# Patient Record
Sex: Female | Born: 1990 | Race: Black or African American | Hispanic: No | Marital: Married | Smoking: Former smoker
Health system: Southern US, Community
[De-identification: ages and names within clinical notes are randomized; demographics above are authoritative.]

## PROBLEM LIST (undated history)

## (undated) ENCOUNTER — Inpatient Hospital Stay (HOSPITAL_COMMUNITY): Payer: Self-pay

## (undated) DIAGNOSIS — N189 Chronic kidney disease, unspecified: Secondary | ICD-10-CM

## (undated) DIAGNOSIS — L0291 Cutaneous abscess, unspecified: Secondary | ICD-10-CM

## (undated) DIAGNOSIS — L309 Dermatitis, unspecified: Secondary | ICD-10-CM

## (undated) DIAGNOSIS — Z789 Other specified health status: Secondary | ICD-10-CM

## (undated) HISTORY — PX: NO PAST SURGERIES: SHX2092

---
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
## 2009-07-14 ENCOUNTER — Emergency Department (HOSPITAL_COMMUNITY): Admission: EM | Admit: 2009-07-14 | Discharge: 2009-07-14 | Payer: Self-pay | Admitting: Emergency Medicine

## 2009-07-24 ENCOUNTER — Emergency Department (HOSPITAL_COMMUNITY): Admission: EM | Admit: 2009-07-24 | Discharge: 2009-07-24 | Payer: Self-pay | Admitting: Family Medicine

## 2009-11-21 ENCOUNTER — Emergency Department (HOSPITAL_COMMUNITY): Admission: EM | Admit: 2009-11-21 | Discharge: 2009-11-21 | Payer: Self-pay | Admitting: Family Medicine

## 2009-11-24 ENCOUNTER — Emergency Department (HOSPITAL_COMMUNITY): Admission: EM | Admit: 2009-11-24 | Discharge: 2009-11-24 | Payer: Self-pay | Admitting: Family Medicine

## 2011-03-12 LAB — POCT PREGNANCY, URINE: Preg Test, Ur: NEGATIVE

## 2011-09-15 LAB — POCT URINALYSIS DIP (DEVICE)
Glucose, UA: NEGATIVE
Ketones, ur: NEGATIVE
Nitrite: NEGATIVE
Operator id: 270961
Protein, ur: NEGATIVE
Specific Gravity, Urine: 1.025
Urobilinogen, UA: 0.2
pH: 6.5

## 2011-09-15 LAB — POCT PREGNANCY, URINE
Operator id: 270961
Preg Test, Ur: NEGATIVE

## 2012-02-08 ENCOUNTER — Emergency Department (HOSPITAL_COMMUNITY): Payer: Self-pay

## 2012-02-08 ENCOUNTER — Encounter (HOSPITAL_COMMUNITY): Payer: Self-pay | Admitting: *Deleted

## 2012-02-08 ENCOUNTER — Emergency Department (HOSPITAL_COMMUNITY)
Admission: EM | Admit: 2012-02-08 | Discharge: 2012-02-08 | Disposition: A | Discharge status: Home Health | Payer: Self-pay | Attending: Emergency Medicine | Admitting: Emergency Medicine

## 2012-02-08 DIAGNOSIS — M25561 Pain in right knee: Secondary | ICD-10-CM

## 2012-02-08 DIAGNOSIS — M25569 Pain in unspecified knee: Secondary | ICD-10-CM | POA: Insufficient documentation

## 2012-02-08 DIAGNOSIS — IMO0002 Reserved for concepts with insufficient information to code with codable children: Secondary | ICD-10-CM | POA: Insufficient documentation

## 2012-02-08 DIAGNOSIS — M25562 Pain in left knee: Secondary | ICD-10-CM

## 2012-02-08 LAB — PREGNANCY, URINE: Preg Test, Ur: NEGATIVE

## 2012-02-08 MED ORDER — HYDROCODONE-ACETAMINOPHEN 5-500 MG PO TABS: 1.0000 | ORAL_TABLET | Freq: Four times a day (QID) | ORAL | Status: AC | PRN

## 2012-02-08 MED ORDER — HYDROCODONE-ACETAMINOPHEN 5-325 MG PO TABS
1.0000 | ORAL_TABLET | Freq: Once | ORAL | Status: AC
  Administered 2012-02-08: 1 via ORAL
  Filled 2012-02-08: qty 1

## 2012-02-08 NOTE — ED Provider Notes (Signed)
I saw and evaluated the patient, reviewed the resident's note and I agree with the findings and plan.  Patient seen and examined. X-rays reviewed. Patient stable for discharge  Toy Baker, MD 02/08/12 1932
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
## 2012-02-08 NOTE — ED Notes (Addendum)
Log rolled per ED MD. LSB removed. C-collar remains in place
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
## 2012-02-08 NOTE — ED Notes (Signed)
Patient is AOx4 and comfortable with her discharge instructions.  The patient's mother is driving her home.
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
## 2012-02-10 NOTE — ED Provider Notes (Signed)
I saw and evaluated the patient, reviewed the resident's note and I agree with the findings and plan.  Story Vanvranken T Shiraz Bastyr, MD 02/10/12 0919 

## 2012-05-15 ENCOUNTER — Encounter (HOSPITAL_COMMUNITY): Payer: Self-pay

## 2012-05-15 ENCOUNTER — Emergency Department (HOSPITAL_COMMUNITY)
Admission: EM | Admit: 2012-05-15 | Discharge: 2012-05-15 | Disposition: A | Discharge status: Home / Self Care | Payer: Self-pay | Attending: Emergency Medicine | Admitting: Emergency Medicine

## 2012-05-15 DIAGNOSIS — O99330 Smoking (tobacco) complicating pregnancy, unspecified trimester: Secondary | ICD-10-CM | POA: Insufficient documentation

## 2012-05-15 DIAGNOSIS — O2390 Unspecified genitourinary tract infection in pregnancy, unspecified trimester: Secondary | ICD-10-CM | POA: Insufficient documentation

## 2012-05-15 DIAGNOSIS — O210 Mild hyperemesis gravidarum: Secondary | ICD-10-CM | POA: Insufficient documentation

## 2012-05-15 DIAGNOSIS — A499 Bacterial infection, unspecified: Secondary | ICD-10-CM | POA: Insufficient documentation

## 2012-05-15 DIAGNOSIS — N760 Acute vaginitis: Secondary | ICD-10-CM | POA: Insufficient documentation

## 2012-05-15 DIAGNOSIS — O219 Vomiting of pregnancy, unspecified: Secondary | ICD-10-CM

## 2012-05-15 DIAGNOSIS — B9689 Other specified bacterial agents as the cause of diseases classified elsewhere: Secondary | ICD-10-CM | POA: Insufficient documentation

## 2012-05-15 LAB — URINE MICROSCOPIC-ADD ON

## 2012-05-15 LAB — URINALYSIS, ROUTINE W REFLEX MICROSCOPIC
Ketones, ur: >80 mg/dL — AB
Leukocytes, UA: NEGATIVE
Nitrite: NEGATIVE
Specific Gravity, Urine: 1.035 — ABNORMAL HIGH (ref 1.005–1.030)
Urobilinogen, UA: 0.2 mg/dL (ref 0.0–1.0)
pH: 6 (ref 5.0–8.0)

## 2012-05-15 LAB — WET PREP, GENITAL
Trich, Wet Prep: NONE SEEN
Yeast Wet Prep HPF POC: NONE SEEN

## 2012-05-15 MED ORDER — SODIUM CHLORIDE 0.9 % IV BOLUS (SEPSIS)
1000.0000 mL | Freq: Once | INTRAVENOUS | Status: AC
  Administered 2012-05-15: 1000 mL via INTRAVENOUS

## 2012-05-15 MED ORDER — ONDANSETRON 8 MG PO TBDP: 8.0000 mg | ORAL_TABLET | Freq: Three times a day (TID) | ORAL | Status: AC | PRN

## 2012-05-15 MED ORDER — METRONIDAZOLE 500 MG PO TABS: 500.0000 mg | ORAL_TABLET | Freq: Two times a day (BID) | ORAL | Status: AC

## 2012-05-15 MED ORDER — ONDANSETRON HCL 4 MG/2ML IJ SOLN
4.0000 mg | Freq: Once | INTRAMUSCULAR | Status: AC
  Administered 2012-05-15: 4 mg via INTRAVENOUS
  Filled 2012-05-15: qty 2

## 2012-05-15 NOTE — Discharge Instructions (Signed)
Morning Sickness Morning sickness is when you feel sick to your stomach (nauseous) during pregnancy. This nauseous feeling may or may not come with throwing up (vomiting). It often occurs in the morning, but can be a problem any time of day. While morning sickness is unpleasant, it is usually harmless unless you develop severe and continual vomiting (hyperemesis gravidarum). This condition requires more intense treatment. CAUSES  The cause of morning sickness is not completely known but seems to be related to a sudden increase of two hormones:   Human chorionic gonadotropin (hCG).   Estrogen hormone.  These are elevated in the first part of the pregnancy. TREATMENT  Do not use any medicines (prescription, over-the-counter, or herbal) for morning sickness without first talking to your caregiver. Some patients are helped by the following:  Vitamin B6 (25mg  every 8 hours) or vitamin B6 shots.   An antihistamine called doxylamine (10mg  every 8 hours).   The herbal medication ginger.  HOME CARE INSTRUCTIONS   Taking multivitamins before getting pregnant can prevent or decrease the severity of morning sickness in most women.   Eat a piece of dry toast or unsalted crackers before getting out of bed in the morning.   Eat 5 or 6 small meals a day.   Eat dry and bland foods (rice, baked potato).   Do not drink liquids with your meals. Drink liquids between meals.   Avoid greasy, fatty, and spicy foods.   Get someone to cook for you if the smell of any food causes nausea and vomiting.   Avoid vitamin pills with iron because iron can cause nausea.   Snack on protein foods between meals if you are hungry.   Eat unsweetened gelatins for deserts.   Wear an acupressure wristband (worn for sea sickness) may be helpful.   Acupuncture may be helpful.   Do not smoke.   Get a humidifier to keep the air in your house free of odors.  SEEK MEDICAL CARE IF:   Your home remedies are not working  and you need medication.   You feel dizzy or lightheaded.   You are losing weight.   You need help with your diet.  SEEK IMMEDIATE MEDICAL CARE IF:   You have persistent and uncontrolled nausea and vomiting.   You pass out (faint).   You have a fever.  MAKE SURE YOU:   Understand these instructions.   Will watch your condition.   Will get help right away if you are not doing well or get worse.  Document Released: 01/12/2007 Document Revised: 11/10/2011 Document Reviewed: 11/09/2007 [facility] Patient Information 2012 [place], [place].Bacterial Vaginosis Bacterial vaginosis (BV) is a vaginal infection where the normal balance of bacteria in the vagina is disrupted. The normal balance is then replaced by an overgrowth of certain bacteria. There are several different kinds of bacteria that can cause BV. BV is the most common vaginal infection in women of childbearing age. CAUSES   The cause of BV is not fully understood. BV develops when there is an increase or imbalance of harmful bacteria.   Some activities or behaviors can upset the normal balance of bacteria in the vagina and put women at increased risk including:   Having a new sex partner or multiple sex partners.   Douching.   Using an intrauterine device (IUD) for contraception.   It is not clear what role sexual activity plays in the development of BV. However, women that have never had sexual intercourse are rarely infected with  BV.  Women do not get BV from toilet seats, bedding, swimming pools or from touching objects around them.  SYMPTOMS   Grey vaginal discharge.   A fish-like odor with discharge, especially after sexual intercourse.   Itching or burning of the vagina and vulva.   Burning or pain with urination.   Some women have no signs or symptoms at all.  DIAGNOSIS  Your caregiver must examine the vagina for signs of BV. Your caregiver will perform lab tests and look at the sample of vaginal fluid  through a microscope. They will look for bacteria and abnormal cells (clue cells), a pH test higher than 4.5, and a positive amine test all associated with BV.  RISKS AND COMPLICATIONS   Pelvic inflammatory disease (PID).   Infections following gynecology surgery.   Developing HIV.   Developing herpes virus.  TREATMENT  Sometimes BV will clear up without treatment. However, all women with symptoms of BV should be treated to avoid complications, especially if gynecology surgery is planned. Female partners generally do not need to be treated. However, BV may spread between female sex partners so treatment is helpful in preventing a recurrence of BV.   BV may be treated with antibiotics. The antibiotics come in either pill or vaginal cream forms. Either can be used with nonpregnant or pregnant women, but the recommended dosages differ. These antibiotics are not harmful to the baby.   BV can recur after treatment. If this happens, a second round of antibiotics will often be prescribed.   Treatment is important for pregnant women. If not treated, BV can cause a premature delivery, especially for a pregnant woman who had a premature birth in the past. All pregnant women who have symptoms of BV should be checked and treated.   For chronic reoccurrence of BV, treatment with a type of prescribed gel vaginally twice a week is helpful.  HOME CARE INSTRUCTIONS   Finish all medication as directed by your caregiver.   Do not have sex until treatment is completed.   Tell your sexual partner that you have a vaginal infection. They should see their caregiver and be treated if they have problems, such as a mild rash or itching.   Practice safe sex. Use condoms. Only have 1 sex partner.  PREVENTION  Basic prevention steps can help reduce the risk of upsetting the natural balance of bacteria in the vagina and developing BV:  Do not have sexual intercourse (be abstinent).   Do not douche.   Use all of  the medicine prescribed for treatment of BV, even if the signs and symptoms go away.   Tell your sex partner if you have BV. That way, they can be treated, if needed, to prevent reoccurrence.  SEEK MEDICAL CARE IF:   Your symptoms are not improving after 3 days of treatment.   You have increased discharge, pain, or fever.  MAKE SURE YOU:   Understand these instructions.   Will watch your condition.   Will get help right away if you are not doing well or get worse.  FOR MORE INFORMATION  Division of STD Prevention (DSTDP), Centers for Disease Control and Prevention: [employer] American Social Health Association (ASHA): www.ashastd.org  Document Released: 11/21/2005 Document Revised: 11/10/2011 Document Reviewed: 05/14/2009 [facility] Patient Information 2012 [place], [place].

## 2012-05-15 NOTE — ED Notes (Signed)
Pt. Also had 1 day of vaginal bleeding and also has a discharge

## 2012-05-15 NOTE — ED Notes (Signed)
NAUSEA AND VOMITNG SINCE THE 6TH.   Pt.  Is approximately 1 month pregnant

## 2012-05-15 NOTE — ED Notes (Signed)
Pt states she hasnt had a period in over 73mo and took home preg test and was positive. Pt states she has had n/v xseveral days. Pt states that she vomits intermittently throughout the day. Pt states she has vomited 3x today.

## 2012-05-15 NOTE — ED Notes (Signed)
Patient is AOx4 and comfortable with her discharge instructions. 

## 2012-05-15 NOTE — ED Provider Notes (Signed)
History     CSN: [id number]  Arrival date & time 05/15/12  1640   First MD Initiated Contact with Patient 05/15/12 1826      Chief Complaint  Patient presents with  . Emesis During Pregnancy    (Consider location/radiation/quality/duration/timing/severity/associated sxs/prior treatment) The history is provided by the patient.   nausea and vomiting for the last 5 days. Patient is pregnant. She states her last period was the end of April and May 1. She's positive home pregnancy test, but has not seen a provider yet. This is her first pregnancy. No diarrhea. Some mild abdominal cramping with vomiting. No fevers. She states she has had a little bit of white vaginal discharge. She also had a little bit of brownish vaginal bleeding yesterday. Patient states she's not excited about being pregnant. No chest pain. The lightheadedness or dizziness. She states she still has a good appetite, but is very nauseous.  History reviewed. No pertinent past medical history.  History reviewed. No pertinent past surgical history.  No family history on file.  History  Substance Use Topics  . Smoking status: Current Everyday Smoker  . Smokeless tobacco: Not on file  . Alcohol Use: Yes     occasionaly, twice weekly    OB History    Grav Para Term Preterm Abortions TAB SAB Ect Mult Living                  Review of Systems  Constitutional: Negative for activity change and appetite change.  HENT: Negative for neck stiffness.   Eyes: Negative for pain.  Respiratory: Negative for chest tightness and shortness of breath.   Cardiovascular: Negative for chest pain and leg swelling.  Gastrointestinal: Positive for nausea and vomiting. Negative for abdominal pain and diarrhea.  Genitourinary: Positive for vaginal bleeding and vaginal discharge. Negative for flank pain.  Musculoskeletal: Negative for back pain.  Skin: Negative for rash.  Neurological: Negative for weakness, numbness and headaches.    Psychiatric/Behavioral: Negative for behavioral problems.    Allergies  Review of patient's allergies indicates no known allergies.  Home Medications   Current Outpatient Rx  Name Route Sig Dispense Refill  . METRONIDAZOLE 500 MG PO TABS Oral Take 1 tablet (500 mg total) by mouth 2 (two) times daily. 14 tablet 0  . ONDANSETRON 8 MG PO TBDP Oral Take 1 tablet (8 mg total) by mouth every 8 (eight) hours as needed for nausea. 20 tablet 0    BP 138/83  Pulse 84  Temp(Src) 99.6 F (37.6 C) (Oral)  Resp 18  Ht 5\' 8"  (1.727 m)  Wt 157 lb (71.215 kg)  BMI 23.87 kg/m2  SpO2 100%  LMP 03/26/2012  Physical Exam  Nursing note and vitals reviewed. Constitutional: She is oriented to person, place, and time. She appears well-developed and well-nourished.  Cardiovascular: Normal rate, regular rhythm and normal heart sounds.   No murmur heard. Pulmonary/Chest: Effort normal and breath sounds normal. No respiratory distress. She has no wheezes. She has no rales.  Abdominal: Soft. Bowel sounds are normal. She exhibits no distension and no mass. There is no tenderness. There is no rebound and no guarding.  Musculoskeletal: Normal range of motion.  Neurological: She is alert and oriented to person, place, and time. No cranial nerve deficit.  Skin: Skin is warm and dry.  Psychiatric: She has a normal mood and affect. Her speech is normal.   pelvic exam showed minimal white discharge with no cervical motion tenderness or adnexal mass.  She does have an apparent gravid uterus. Os is closed  ED Course  Procedures (including critical care time)  Labs Reviewed  URINALYSIS, ROUTINE W REFLEX MICROSCOPIC - Abnormal; Notable for the following:    APPearance HAZY (*)    Specific Gravity, Urine 1.035 (*)    Bilirubin Urine SMALL (*)    Ketones, ur >80 (*)    Protein, ur 100 (*)    All other components within normal limits  PREGNANCY, URINE - Abnormal; Notable for the following:    Preg Test, Ur  POSITIVE (*)    All other components within normal limits  WET PREP, GENITAL - Abnormal; Notable for the following:    Clue Cells Wet Prep HPF POC MODERATE (*)    WBC, Wet Prep HPF POC MODERATE (*)    All other components within normal limits  URINE MICROSCOPIC-ADD ON - Abnormal; Notable for the following:    Squamous Epithelial / LPF FEW (*)    Casts HYALINE CASTS (*)    All other components within normal limits  GC/CHLAMYDIA PROBE AMP, GENITAL   No results found.   1. Nausea and vomiting in pregnancy   2. Bacterial vaginosis       MDM  Patient with nausea and vomiting for the last 5 days. She is pregnant. Her last period was the end of April. She has a positive pregnancy test here. She has a high specific gravity in her urine and greater than 80 ketones. Patient feels much better after nearly 2 L of fluid. She's tolerated orals will be discharged home. Pelvic exam showed a bacterial vaginosis and she will be treated. She will followup with gynecology        Juliet Rude. Rubin Payor, MD 05/15/12 2129

## 2012-05-16 LAB — GC/CHLAMYDIA PROBE AMP, GENITAL: Chlamydia, DNA Probe: NEGATIVE

## 2012-06-19 ENCOUNTER — Emergency Department (HOSPITAL_COMMUNITY)
Admission: EM | Admit: 2012-06-19 | Discharge: 2012-06-20 | Disposition: A | Discharge status: Home / Self Care | Payer: Self-pay | Attending: Emergency Medicine | Admitting: Emergency Medicine

## 2012-06-19 ENCOUNTER — Encounter (HOSPITAL_COMMUNITY): Payer: Self-pay | Admitting: Emergency Medicine

## 2012-06-19 DIAGNOSIS — O210 Mild hyperemesis gravidarum: Secondary | ICD-10-CM | POA: Insufficient documentation

## 2012-06-19 DIAGNOSIS — R112 Nausea with vomiting, unspecified: Secondary | ICD-10-CM

## 2012-06-19 DIAGNOSIS — Z87891 Personal history of nicotine dependence: Secondary | ICD-10-CM | POA: Insufficient documentation

## 2012-06-19 LAB — CBC WITH DIFFERENTIAL/PLATELET
Basophils Absolute: 0 10*3/uL (ref 0.0–0.1)
Basophils Relative: 0 % (ref 0–1)
Eosinophils Absolute: 0.1 10*3/uL (ref 0.0–0.7)
Eosinophils Relative: 1 % (ref 0–5)
HCT: 39.9 % (ref 36.0–46.0)
Lymphocytes Relative: 22 % (ref 12–46)
MCHC: 36.1 g/dL — ABNORMAL HIGH (ref 30.0–36.0)
MCV: 78.1 fL (ref 78.0–100.0)
Monocytes Absolute: 0.5 10*3/uL (ref 0.1–1.0)
Platelets: 321 10*3/uL (ref 150–400)
RDW: 12.1 % (ref 11.5–15.5)
WBC: 8.2 10*3/uL (ref 4.0–10.5)

## 2012-06-19 LAB — POCT I-STAT, CHEM 8
Chloride: 106 mEq/L (ref 96–112)
Creatinine, Ser: 0.7 mg/dL (ref 0.50–1.10)
Glucose, Bld: 114 mg/dL — ABNORMAL HIGH (ref 70–99)
HCT: 44 % (ref 36.0–46.0)
Hemoglobin: 15 g/dL (ref 12.0–15.0)
Potassium: 3.6 mEq/L (ref 3.5–5.1)
Sodium: 139 mEq/L (ref 135–145)

## 2012-06-19 LAB — COMPREHENSIVE METABOLIC PANEL
ALT: 10 U/L (ref 0–35)
AST: 27 U/L (ref 0–37)
Albumin: 4.2 g/dL (ref 3.5–5.2)
Calcium: 10.2 mg/dL (ref 8.4–10.5)
Creatinine, Ser: 0.57 mg/dL (ref 0.50–1.10)
Sodium: 136 mEq/L (ref 135–145)
Total Protein: 9.1 g/dL — ABNORMAL HIGH (ref 6.0–8.3)

## 2012-06-19 MED ORDER — PROMETHAZINE HCL 25 MG RE SUPP: 25.0000 mg | Freq: Four times a day (QID) | RECTAL | Status: DC | PRN

## 2012-06-19 MED ORDER — SODIUM CHLORIDE 0.9 % IV BOLUS (SEPSIS): 500.0000 mL | Freq: Once | INTRAVENOUS | Status: DC

## 2012-06-19 MED ORDER — PROMETHAZINE HCL 25 MG/ML IJ SOLN
12.5000 mg | Freq: Once | INTRAMUSCULAR | Status: AC
  Administered 2012-06-19: 12.5 mg via INTRAVENOUS
  Filled 2012-06-19: qty 1

## 2012-06-19 MED ORDER — ONDANSETRON HCL 4 MG/2ML IJ SOLN
4.0000 mg | Freq: Once | INTRAMUSCULAR | Status: AC
  Administered 2012-06-19: 4 mg via INTRAVENOUS
  Filled 2012-06-19: qty 2

## 2012-06-19 MED ORDER — SODIUM CHLORIDE 0.9 % IV SOLN
1000.0000 mL | INTRAVENOUS | Status: DC
  Administered 2012-06-19 (×2): 1000 mL via INTRAVENOUS

## 2012-06-19 MED ORDER — DEXTROSE 5 % AND 0.9 % NACL IV BOLUS
1000.0000 mL | Freq: Once | INTRAVENOUS | Status: AC
  Administered 2012-06-19: 1000 mL via INTRAVENOUS

## 2012-06-19 NOTE — ED Notes (Signed)
Pt reports she is about [redacted] weeks pregnant and for almost month or more has been vomiting, unable to keep anything down; pt actively vomiting at triage; pt was here last month for same and d/c'ed home on zofran- reports zofran is not working

## 2012-06-19 NOTE — ED Notes (Signed)
Pt eating ice chips with no complaints of increased nausea or vomiting.

## 2012-06-19 NOTE — ED Provider Notes (Signed)
History     CSN: [id number]  Arrival date & time 06/19/12  2030   First MD Initiated Contact with Patient 06/19/12 2137      Chief Complaint  Patient presents with  . Hyperemesis Gravidarum    (Consider location/radiation/quality/duration/timing/severity/associated sxs/prior treatment) The history is provided by the patient.  pt g1p0 states is [redacted] weeks pregnant. States for past 1-2 months intermittent nv. Emesis clear, or color recent ingested food/liquids, no bloody or bilious emesis. Having normal bms, including one today, no diarrhea or constipation. No abdominal or pelvic pain/cramping, no back or flank pain. No vaginal bleeding or discharge. Urinating regularly, no urgency or dysuria. Denies fever or chils. Denies abdominal distension or prior abd surgery. No hx pud or gallstones.  No fainting/near syncope.   History reviewed. No pertinent past medical history.  History reviewed. No pertinent past surgical history.  History reviewed. No pertinent family history.  History  Substance Use Topics  . Smoking status: Former Games developer  . Smokeless tobacco: Not on file  . Alcohol Use: No    OB History    Grav Para Term Preterm Abortions TAB SAB Ect Mult Living   1               Review of Systems  Constitutional: Negative for fever and chills.  Respiratory: Negative for cough and shortness of breath.   Cardiovascular: Negative for chest pain.  Gastrointestinal: Negative for abdominal pain.  Genitourinary: Negative for dysuria, vaginal bleeding and vaginal discharge.  Musculoskeletal: Negative for back pain.  Neurological: Negative for headaches.    Allergies  Review of patient's allergies indicates no known allergies.  Home Medications   Current Outpatient Rx  Name Route Sig Dispense Refill  . METRONIDAZOLE 500 MG PO TABS Oral Take 500 mg by mouth 2 (two) times daily.    Marland Kitchen ONDANSETRON 8 MG PO TBDP Oral Take 8 mg by mouth every 8 (eight) hours as needed. For nausea       BP 141/91  Pulse 86  Resp 22  SpO2 100%  LMP 03/30/2012  Physical Exam  Nursing note and vitals reviewed. Constitutional: She is oriented to person, place, and time. She appears well-developed and well-nourished. No distress.  HENT:  Nose: Nose normal.  Mouth/Throat: Oropharynx is clear and moist.  Eyes: Conjunctivae are normal. No scleral icterus.  Neck: Neck supple. No tracheal deviation present.  Cardiovascular: Normal rate, regular rhythm, normal heart sounds and intact distal pulses.   Pulmonary/Chest: Effort normal and breath sounds normal. No respiratory distress.  Abdominal: Soft. Normal appearance and bowel sounds are normal. She exhibits no distension and no mass. There is no tenderness. There is no rebound and no guarding.  Genitourinary:       No  cva tenderness  Musculoskeletal: She exhibits no edema.  Neurological: She is alert and oriented to person, place, and time.  Skin: Skin is warm and dry. No rash noted.  Psychiatric: She has a normal mood and affect.    ED Course  Procedures (including critical care time)   Results for orders placed during the hospital encounter of 06/19/12  CBC WITH DIFFERENTIAL      Component Value Range   WBC 8.2  4.0 - 10.5 K/uL   RBC 5.11  3.87 - 5.11 MIL/uL   Hemoglobin 14.4  12.0 - 15.0 g/dL   HCT 11.9  14.7 - 82.9 %   MCV 78.1  78.0 - 100.0 fL   MCH 28.2  26.0 - 34.0 pg  MCHC 36.1 (*) 30.0 - 36.0 g/dL   RDW 29.5  62.1 - 30.8 %   Platelets 321  150 - 400 K/uL   Neutrophils Relative 72  43 - 77 %   Neutro Abs 5.9  1.7 - 7.7 K/uL   Lymphocytes Relative 22  12 - 46 %   Lymphs Abs 1.8  0.7 - 4.0 K/uL   Monocytes Relative 6  3 - 12 %   Monocytes Absolute 0.5  0.1 - 1.0 K/uL   Eosinophils Relative 1  0 - 5 %   Eosinophils Absolute 0.1  0.0 - 0.7 K/uL   Basophils Relative 0  0 - 1 %   Basophils Absolute 0.0  0.0 - 0.1 K/uL  COMPREHENSIVE METABOLIC PANEL      Component Value Range   Sodium 136  135 - 145 mEq/L    Potassium 3.9  3.5 - 5.1 mEq/L   Chloride 97  96 - 112 mEq/L   CO2 20  19 - 32 mEq/L   Glucose, Bld 111 (*) 70 - 99 mg/dL   BUN 7  6 - 23 mg/dL   Creatinine, Ser 6.57  0.50 - 1.10 mg/dL   Calcium 84.6  8.4 - 96.2 mg/dL   Total Protein 9.1 (*) 6.0 - 8.3 g/dL   Albumin 4.2  3.5 - 5.2 g/dL   AST 27  0 - 37 U/L   ALT 10  0 - 35 U/L   Alkaline Phosphatase 56  39 - 117 U/L   Total Bilirubin 0.5  0.3 - 1.2 mg/dL   GFR calc non Af Amer >90  >90 mL/min   GFR calc Af Amer >90  >90 mL/min  POCT I-STAT, CHEM 8      Component Value Range   Sodium 139  135 - 145 mEq/L   Potassium 3.6  3.5 - 5.1 mEq/L   Chloride 106  96 - 112 mEq/L   BUN 8  6 - 23 mg/dL   Creatinine, Ser 9.52  0.50 - 1.10 mg/dL   Glucose, Bld 841 (*) 70 - 99 mg/dL   Calcium, Ion 3.24  4.01 - 1.23 mmol/L   TCO2 21  0 - 100 mmol/L   Hemoglobin 15.0  12.0 - 15.0 g/dL   HCT 02.7  25.3 - 66.4 %        MDM  Iv ns bolus.  zofran iv.  Mild nausea persists. Phenergan 12.5 mg iv. d5ns iv.   Recheck pt improved. Tolerating po. No recurrent nv. abd soft nt. No abd or pelvic pain. No gu c/o.         Suzi Roots, MD 06/19/12 [phone]

## 2012-06-20 LAB — URINE MICROSCOPIC-ADD ON

## 2012-06-20 LAB — URINALYSIS, ROUTINE W REFLEX MICROSCOPIC
Bilirubin Urine: NEGATIVE
Hgb urine dipstick: NEGATIVE
Specific Gravity, Urine: 1.027 (ref 1.005–1.030)
Urobilinogen, UA: 0.2 mg/dL (ref 0.0–1.0)
pH: 7 (ref 5.0–8.0)

## 2012-06-20 NOTE — ED Notes (Signed)
Pt states she is not happy with her care because she feels the same now as when she came in although pt denies nausea at this time. Pt now complaining of pain, pt's complaint of pain discussed with Dr Norlene Campbell and verbal order for Tylenol obtained. Pt refuses Tylenol stating she just wants to go home. Pt offered crackers and ginger ale prior to departure but pt refused stating "that don't help". Pt refuses to sign for discharge at this time.

## 2012-11-26 ENCOUNTER — Inpatient Hospital Stay (HOSPITAL_COMMUNITY)
Admission: AD | Admit: 2012-11-26 | Discharge: 2012-11-26 | Disposition: A | Discharge status: Hospice | Payer: Medicaid Other | Source: Ambulatory Visit | Attending: Obstetrics & Gynecology | Admitting: Obstetrics & Gynecology

## 2012-11-26 ENCOUNTER — Encounter (HOSPITAL_COMMUNITY): Payer: Self-pay | Admitting: *Deleted

## 2012-11-26 DIAGNOSIS — B3731 Acute candidiasis of vulva and vagina: Secondary | ICD-10-CM | POA: Insufficient documentation

## 2012-11-26 DIAGNOSIS — B373 Candidiasis of vulva and vagina: Secondary | ICD-10-CM

## 2012-11-26 DIAGNOSIS — O0930 Supervision of pregnancy with insufficient antenatal care, unspecified trimester: Secondary | ICD-10-CM

## 2012-11-26 DIAGNOSIS — O2390 Unspecified genitourinary tract infection in pregnancy, unspecified trimester: Secondary | ICD-10-CM | POA: Insufficient documentation

## 2012-11-26 HISTORY — DX: Other specified health status: Z78.9

## 2012-11-26 LAB — CBC
Hemoglobin: 10.3 g/dL — ABNORMAL LOW (ref 12.0–15.0)
MCH: 26.5 pg (ref 26.0–34.0)
MCHC: 32.8 g/dL (ref 30.0–36.0)
Platelets: 214 10*3/uL (ref 150–400)
RBC: 3.89 MIL/uL (ref 3.87–5.11)

## 2012-11-26 LAB — URINALYSIS, ROUTINE W REFLEX MICROSCOPIC
Glucose, UA: NEGATIVE mg/dL
Ketones, ur: NEGATIVE mg/dL
Nitrite: NEGATIVE
Protein, ur: NEGATIVE mg/dL
Urobilinogen, UA: 0.2 mg/dL (ref 0.0–1.0)

## 2012-11-26 LAB — URINE MICROSCOPIC-ADD ON

## 2012-11-26 LAB — HEPATITIS B SURFACE ANTIGEN: Hepatitis B Surface Ag: NEGATIVE

## 2012-11-26 LAB — HIV ANTIBODY (ROUTINE TESTING W REFLEX): HIV: NONREACTIVE

## 2012-11-26 LAB — RPR: RPR Ser Ql: NONREACTIVE

## 2012-11-26 LAB — WET PREP, GENITAL: Trich, Wet Prep: NONE SEEN

## 2012-11-26 LAB — ABO/RH: ABO/RH(D): O POS

## 2012-11-26 MED ORDER — FLUCONAZOLE 150 MG PO TABS
150.0000 mg | ORAL_TABLET | Freq: Once | ORAL | Status: AC
  Administered 2012-11-26: 150 mg via ORAL
  Filled 2012-11-26: qty 1

## 2012-11-26 NOTE — MAU Provider Note (Signed)
Chart reviewed and agree with management and plan.  

## 2012-11-26 NOTE — MAU Note (Signed)
Has not been seen since June, no care. "has been kind of depressed, in a funk". Hasn't been taking care of self.

## 2012-11-26 NOTE — MAU Provider Note (Signed)
History     CSN: [id number]  Arrival date and time: 11/26/12 1417   First Provider Initiated Contact with Patient 11/26/12 1621      Chief Complaint  Patient presents with  . ~ 35wks, needs preg verification    HPI 21 y.o. G1P0 at [redacted]w[redacted]d with no prenatal care here to get pregnancy verification letter for Medicaid. Unexpected pregnancy, has not established care/Medicaid because still adjusting to being pregnant. Does c/o vaginal itching, moderate discharge - white, no odor. No dysuria. No nausea/vomiting/diarrhea. Feeling fetal movement, no contractions, bleeding or loss of fluid. No hx STDs.  LMP:  03/30/12.    OB History    Grav Para Term Preterm Abortions TAB SAB Ect Mult Living   1               Past Medical History  Diagnosis Date  . No pertinent past medical history     Past Surgical History  Procedure Date  . No past surgeries     Family History  Problem Relation Age of Onset  . Diabetes Mother   . Hypertension Mother   . Stroke Mother   . Heart disease Mother   . Diabetes Maternal Grandmother     History  Substance Use Topics  . Smoking status: Former Smoker    Quit date: 06/26/2012  . Smokeless tobacco: Not on file  . Alcohol Use: No  Marijuana before pregnancy  Allergies:  Allergies  Allergen Reactions  . Nickel Rash    Prescriptions prior to admission  Medication Sig Dispense Refill  . acetaminophen (TYLENOL) 500 MG tablet Take 1,000 mg by mouth every 6 (six) hours as needed. Tooth ache      . promethazine (PHENERGAN) 25 MG suppository Place 1 suppository (25 mg total) rectally every 6 (six) hours as needed for nausea.  12 suppository  0  . [DISCONTINUED] metroNIDAZOLE (FLAGYL) 500 MG tablet Take 500 mg by mouth 2 (two) times daily.      . [DISCONTINUED] ondansetron (ZOFRAN-ODT) 8 MG disintegrating tablet Take 8 mg by mouth every 8 (eight) hours as needed. For nausea        Review of Systems  Constitutional: Negative for fever and chills.   Eyes: Negative for blurred vision.  Respiratory: Negative for shortness of breath.   Gastrointestinal: Negative for nausea, vomiting, abdominal pain, diarrhea and constipation.  Genitourinary: Negative for dysuria and urgency.  Neurological: Negative for dizziness and headaches.   Physical Exam   Blood pressure 136/73, pulse 82, temperature 98.5 F (36.9 C), temperature source Oral, resp. rate 16, height 5\' 7"  (1.702 m), weight 68.947 kg (152 lb), last menstrual period 03/30/2012.  Physical Exam  Constitutional: She is oriented to person, place, and time. She appears well-developed and well-nourished. No distress.  HENT:  Head: Normocephalic and atraumatic.  Eyes: Conjunctivae normal and EOM are normal.  Neck: Normal range of motion. Neck supple.  Cardiovascular: Normal rate, regular rhythm and normal heart sounds.   Respiratory: Effort normal and breath sounds normal. No respiratory distress.  GI: Soft. Bowel sounds are normal. There is no tenderness. There is no rebound and no guarding.  Genitourinary:       Normal external genitalia and vagina. Small amount thick white discharge. Normal cervix, no CMT, closed/thick/high.    Musculoskeletal: Normal range of motion. She exhibits no edema and no tenderness.  Neurological: She is alert and oriented to person, place, and time.  Skin: Skin is warm and dry.  Psychiatric: She has a normal  mood and affect.   Results for orders placed during the hospital encounter of 11/26/12 (from the past 24 hour(s))  URINALYSIS, ROUTINE W REFLEX MICROSCOPIC     Status: Abnormal   Collection Time   11/26/12  3:05 PM      Component Value Range   Color, Urine YELLOW  YELLOW   APPearance CLOUDY (*) CLEAR   Specific Gravity, Urine 1.020  1.005 - 1.030   pH 6.5  5.0 - 8.0   Glucose, UA NEGATIVE  NEGATIVE mg/dL   Hgb urine dipstick NEGATIVE  NEGATIVE   Bilirubin Urine NEGATIVE  NEGATIVE   Ketones, ur NEGATIVE  NEGATIVE mg/dL   Protein, ur NEGATIVE   NEGATIVE mg/dL   Urobilinogen, UA 0.2  0.0 - 1.0 mg/dL   Nitrite NEGATIVE  NEGATIVE   Leukocytes, UA SMALL (*) NEGATIVE  URINE MICROSCOPIC-ADD ON     Status: Abnormal   Collection Time   11/26/12  3:05 PM      Component Value Range   Squamous Epithelial / LPF MANY (*) RARE   WBC, UA 0-2  <3 WBC/hpf   RBC / HPF 0-2  <3 RBC/hpf   Bacteria, UA FEW (*) RARE  WET PREP, GENITAL     Status: Abnormal   Collection Time   11/26/12  5:00 PM      Component Value Range   Yeast Wet Prep HPF POC FEW (*) NONE SEEN   Trich, Wet Prep NONE SEEN  NONE SEEN   Clue Cells Wet Prep HPF POC NONE SEEN  NONE SEEN   WBC, Wet Prep HPF POC MANY (*) NONE SEEN     MAU Course  Procedures  NST:  135, moderate variability, accels present, no decels.  Assessment and Plan  21 y.o. G1P0 at [redacted]w[redacted]d with no prenatal care. - Outpatient ultrasound for dating - Establish care - will [facility] and give list of providers including health dept. - Vaginal yeast infection - Letter for Medicaid provided - Labor precautions discussed.  Napoleon Form, MD 11/26/2012  5:28 PM   Napoleon Form 11/26/2012, 4:22 PM

## 2012-11-27 LAB — GC/CHLAMYDIA PROBE AMP: CT Probe RNA: NEGATIVE

## 2012-11-27 LAB — RUBELLA SCREEN: Rubella: 4.5 Index — ABNORMAL HIGH (ref <0.90)

## 2012-11-29 ENCOUNTER — Other Ambulatory Visit (HOSPITAL_COMMUNITY): Payer: Self-pay | Admitting: Family Medicine

## 2012-11-29 ENCOUNTER — Ambulatory Visit (HOSPITAL_COMMUNITY)
Admission: RE | Admit: 2012-11-29 | Discharge: 2012-11-29 | Disposition: A | Discharge status: Home / Self Care | Payer: Medicaid Other | Source: Ambulatory Visit | Attending: Family Medicine | Admitting: Family Medicine

## 2012-11-29 DIAGNOSIS — O0930 Supervision of pregnancy with insufficient antenatal care, unspecified trimester: Secondary | ICD-10-CM

## 2012-11-29 DIAGNOSIS — Z3689 Encounter for other specified antenatal screening: Secondary | ICD-10-CM | POA: Insufficient documentation

## 2012-12-05 NOTE — L&D Delivery Note (Signed)
Attestation of Attending Supervision of Advanced Practitioner (CNM/NP): Evaluation and management procedures were performed by the Advanced Practitioner under my supervision and collaboration.  I have reviewed the Advanced Practitioner's note and chart, and I agree with the management and plan.  Margeret Stachnik 01/03/2013 8:35 AM

## 2012-12-05 NOTE — L&D Delivery Note (Signed)
Delivery Note At 5:32 AM a viable female was delivered via Vaginal, Spontaneous Delivery (Presentation:Right Occiput Anterior) en caul w/ srom immediately after birth w/ large amount of light MSF.  APGAR:9/9 ; weight:pending .   Placenta status: Intact, Spontaneous, appears small.  Cord: 3 vessels with the following complications: None.  Cord pH: not done.  Anesthesia: Epidural  Episiotomy: None Lacerations: 1st degree Lt sulcus Suture Repair: 3.0 monocryl Est. Blood Loss (mL):  Mom to postpartum.  Baby to nursery-stable. Placenta to path. Plans to pump and bottlefeed, undecided about contraception, wants OP circumcision.  Marge Duncans 12/30/2012, 5:56 AM

## 2012-12-26 ENCOUNTER — Ambulatory Visit (INDEPENDENT_AMBULATORY_CARE_PROVIDER_SITE_OTHER): Payer: Medicaid Other | Admitting: Family Medicine

## 2012-12-26 ENCOUNTER — Other Ambulatory Visit (HOSPITAL_COMMUNITY)
Admission: RE | Admit: 2012-12-26 | Discharge: 2012-12-26 | Disposition: A | Discharge status: Home / Self Care | Payer: Medicaid Other | Source: Ambulatory Visit | Attending: Family Medicine | Admitting: Family Medicine

## 2012-12-26 ENCOUNTER — Encounter: Payer: Self-pay | Admitting: Family Medicine

## 2012-12-26 VITALS — BP 135/87 | Temp 98.0°F | Wt 175.3 lb

## 2012-12-26 DIAGNOSIS — Z23 Encounter for immunization: Secondary | ICD-10-CM

## 2012-12-26 DIAGNOSIS — O0930 Supervision of pregnancy with insufficient antenatal care, unspecified trimester: Secondary | ICD-10-CM | POA: Insufficient documentation

## 2012-12-26 DIAGNOSIS — Z113 Encounter for screening for infections with a predominantly sexual mode of transmission: Secondary | ICD-10-CM | POA: Insufficient documentation

## 2012-12-26 DIAGNOSIS — Z01419 Encounter for gynecological examination (general) (routine) without abnormal findings: Secondary | ICD-10-CM | POA: Insufficient documentation

## 2012-12-26 DIAGNOSIS — N760 Acute vaginitis: Secondary | ICD-10-CM | POA: Insufficient documentation

## 2012-12-26 LAB — POCT URINALYSIS DIP (DEVICE)
Bilirubin Urine: NEGATIVE
Hgb urine dipstick: NEGATIVE
Ketones, ur: NEGATIVE mg/dL
Protein, ur: NEGATIVE mg/dL
pH: 5.5 (ref 5.0–8.0)

## 2012-12-26 MED ORDER — INFLUENZA VIRUS VACC SPLIT PF IM SUSP
0.5000 mL | Freq: Once | INTRAMUSCULAR | Status: AC
  Administered 2012-12-26: 0.5 mL via INTRAMUSCULAR

## 2012-12-26 MED ORDER — TETANUS-DIPHTH-ACELL PERTUSSIS 5-2.5-18.5 LF-MCG/0.5 IM SUSP
0.5000 mL | Freq: Once | INTRAMUSCULAR | Status: AC
  Administered 2012-12-26: 0.5 mL via INTRAMUSCULAR

## 2012-12-26 NOTE — Progress Notes (Signed)
Informal [place] for presentation- vertex

## 2012-12-26 NOTE — Progress Notes (Signed)
Subjective:    JOELLYN GRANDT is being seen today for her first obstetrical visit.  This is not a planned pregnancy. Did not get prenatal care earlier because she was adjusting to the idea of being pregnant. She is at [redacted]w[redacted]d gestation by LMP c/w u/s at 34 weeks. Her obstetrical history is significant for none. Relationship with FOB: not involved, not together at this time. Patient states she has a lot of social and family support though. Patient does not want to breast feed but would like to try to pump. 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 Pregnancy history fully reviewed.  Stopped drinking when found out pregnant. Stopped smoking early pregnancy.   Menstrual History: OB History    Grav Para Term Preterm Abortions TAB SAB Ect Mult Living   1              Menarche age: 22 yrs Patient's last menstrual period was 03/30/2012.   The following portions of the patient's history were reviewed and updated as appropriate: allergies, current medications, past family history, past medical history, past social history, past surgical history and problem list.  Review of Systems Constitutional: negative except for chills, fevers and sweats Eyes: negative for visual disturbance Respiratory: negative for cough, dyspnea on exertion and wheezing Cardiovascular: negative for chest pain, dyspnea and palpitations Gastrointestinal: negative for abdominal pain, constipation, diarrhea, nausea and vomiting Genitourinary:negative for dysuria, urgency, vaginal bleeding Neurological: negative for headache, vision changes Behavioral/Psych: negative for depression, anxiety.     Objective:    General appearance: alert, cooperative and no distress Head: Normocephalic, without obvious abnormality, atraumatic Eyes: conjunctivae/corneas clear. PERRL, EOM's intact. Fundi benign. Neck: no adenopathy, supple, symmetrical, trachea midline and thyroid not enlarged, symmetric, no  tenderness/mass/nodules Lungs: clear to auscultation bilaterally Heart: regular rate and rhythm, S1, S2 normal, no murmur, click, rub or gallop Abdomen: soft, gravid size appropriate for dates, normal bowel sounds, non-tender Pelvic: external genitalia normal, no cervical motion tenderness, uterus normal size, shape, and consistency, vagina normal without discharge and cervix FT and long by digital exam. Bleeding noted from cervix after pap. Extremities: extremities normal, atraumatic, no cyanosis or edema Pulses: 2+ and symmetric Skin: Skin color, texture, turgor normal. No rashes or lesions Neurologic: Grossly normal    Assessment:    Pregnancy at 38 and 5/7 weeks    Plan:    Initial labs drawn. Prenatal vitamins. Problem list reviewed and updated. AFP3 discussed: too late. Role of ultrasound in pregnancy discussed; fetal survey: results reviewed. Amniocentesis discussed: not indicated. Follow up in 1 weeks. 50% of 45 min visit spent on counseling and coordination of care.

## 2012-12-26 NOTE — Progress Notes (Signed)
P=78, Here for initial prenatal visit. States baby has slowed down , but still feels baby move 10 times an hour.Given new patient booklet, discussed appropriate weight gain.

## 2012-12-26 NOTE — Patient Instructions (Addendum)
Pregnancy - Third Trimester  The third trimester of pregnancy (the last 3 months) is a period of the most rapid growth for you and your baby. The baby approaches a length of 20 inches and a weight of 6 to 10 pounds. The baby is adding on fat and getting ready for life outside your body. While inside, babies have periods of sleeping and waking, suck their thumbs, and hiccups. You can often feel small contractions of the uterus. This is false labor. It is also called Braxton-Hicks contractions. This is like a practice for labor. The usual problems in this stage of pregnancy include more difficulty breathing, swelling of the hands and feet from water retention, and having to urinate more often because of the uterus and baby pressing on your bladder.   PRENATAL EXAMS  · Blood work may continue to be done during prenatal exams. These tests are done to check on your health and the probable health of your baby. Blood work is used to follow your blood levels (hemoglobin). Anemia (low hemoglobin) is common during pregnancy. Iron and vitamins are given to help prevent this. You may also continue to be checked for diabetes. Some of the past blood tests may be done again.  · The size of the uterus is measured during each visit. This makes sure your baby is growing properly according to your pregnancy dates.  · Your blood pressure is checked every prenatal visit. This is to make sure you are not getting toxemia.  · Your urine is checked every prenatal visit for infection, diabetes and protein.  · Your weight is checked at each visit. This is done to make sure gains are happening at the suggested rate and that you and your baby are growing normally.  · Sometimes, an ultrasound is performed to confirm the position and the proper growth and development of the baby. This is a test done that bounces harmless sound waves off the baby so your caregiver can more accurately determine due dates.  · Discuss the type of pain medication and  anesthesia you will have during your labor and delivery.  · Discuss the possibility and anesthesia if a Cesarean Section might be necessary.  · Inform your caregiver if there is any mental or physical violence at home.  Sometimes, a specialized non-stress test, contraction stress test and biophysical profile are done to make sure the baby is not having a problem. Checking the amniotic fluid surrounding the baby is called an amniocentesis. The amniotic fluid is removed by sticking a needle into the belly (abdomen). This is sometimes done near the end of pregnancy if an early delivery is required. In this case, it is done to help make sure the baby's lungs are mature enough for the baby to live outside of the womb. If the lungs are not mature and it is unsafe to deliver the baby, an injection of cortisone medication is given to the mother 1 to 2 days before the delivery. This helps the baby's lungs mature and makes it safer to deliver the baby.  CHANGES OCCURING IN THE THIRD TRIMESTER OF PREGNANCY  Your body goes through many changes during pregnancy. They vary from person to person. Talk to your caregiver about changes you notice and are concerned about.  · During the last trimester, you have probably had an increase in your appetite. It is normal to have cravings for certain foods. This varies from person to person and pregnancy to pregnancy.  · You may begin to   get stretch marks on your hips, abdomen, and breasts. These are normal changes in the body during pregnancy. There are no exercises or medications to take which prevent this change.  · Constipation may be treated with a stool softener or adding bulk to your diet. Drinking lots of fluids, fiber in vegetables, fruits, and whole grains are helpful.  · Exercising is also helpful. If you have been very active up until your pregnancy, most of these activities can be continued during your pregnancy. If you have been less active, it is helpful to start an exercise  program such as walking. Consult your caregiver before starting exercise programs.  · Avoid all smoking, alcohol, un-prescribed drugs, herbs and "street drugs" during your pregnancy. These chemicals affect the formation and growth of the baby. Avoid chemicals throughout the pregnancy to ensure the delivery of a healthy infant.  · Backache, varicose veins and hemorrhoids may develop or get worse.  · You will tire more easily in the third trimester, which is normal.  · The baby's movements may be stronger and more often.  · You may become short of breath easily.  · Your belly button may stick out.  · A yellow discharge may leak from your breasts called colostrum.  · You may have a bloody mucus discharge. This usually occurs a few days to a week before labor begins.  HOME CARE INSTRUCTIONS   · Keep your caregiver's appointments. Follow your caregiver's instructions regarding medication use, exercise, and diet.  · During pregnancy, you are providing food for you and your baby. Continue to eat regular, well-balanced meals. Choose foods such as meat, fish, milk and other low fat dairy products, vegetables, fruits, and whole-grain breads and cereals. Your caregiver will tell you of the ideal weight gain.  · A physical sexual relationship may be continued throughout pregnancy if there are no other problems such as early (premature) leaking of amniotic fluid from the membranes, vaginal bleeding, or belly (abdominal) pain.  · Exercise regularly if there are no restrictions. Check with your caregiver if you are unsure of the safety of your exercises. Greater weight gain will occur in the last 2 trimesters of pregnancy. Exercising helps:  · Control your weight.  · Get you in shape for labor and delivery.  · You lose weight after you deliver.  · Rest a lot with legs elevated, or as needed for leg cramps or low back pain.  · Wear a good support or jogging bra for breast tenderness during pregnancy. This may help if worn during  sleep. Pads or tissues may be used in the bra if you are leaking colostrum.  · Do not use hot tubs, steam rooms, or saunas.  · Wear your seat belt when driving. This protects you and your baby if you are in an accident.  · Avoid raw meat, cat litter boxes and soil used by cats. These carry germs that can cause birth defects in the baby.  · It is easier to loose urine during pregnancy. Tightening up and strengthening the pelvic muscles will help with this problem. You can practice stopping your urination while you are going to the bathroom. These are the same muscles you need to strengthen. It is also the muscles you would use if you were trying to stop from passing gas. You can practice tightening these muscles up 10 times a set and repeating this about 3 times per day. Once you know what muscles to tighten up, do not perform these   exercises during urination. It is more likely to cause an infection by backing up the urine.  · Ask for help if you have financial, counseling or nutritional needs during pregnancy. Your caregiver will be able to offer counseling for these needs as well as refer you for other special needs.  · Make a list of emergency phone numbers and have them available.  · Plan on getting help from family or friends when you go home from the hospital.  · Make a trial run to the hospital.  · Take prenatal classes with the father to understand, practice and ask questions about the labor and delivery.  · Prepare the baby's room/nursery.  · Do not travel out of the city unless it is absolutely necessary and with the advice of your caregiver.  · Wear only low or no heal shoes to have better balance and prevent falling.  MEDICATIONS AND DRUG USE IN PREGNANCY  · Take prenatal vitamins as directed. The vitamin should contain 1 milligram of folic acid. Keep all vitamins out of reach of children. Only a couple vitamins or tablets containing iron may be fatal to a baby or young child when ingested.  · Avoid use  of all medications, including herbs, over-the-counter medications, not prescribed or suggested by your caregiver. Only take over-the-counter or prescription medicines for pain, discomfort, or fever as directed by your caregiver. Do not use aspirin, ibuprofen (Motrin®, Advil®, Nuprin®) or naproxen (Aleve®) unless OK'd by your caregiver.  · Let your caregiver also know about herbs you may be using.  · Alcohol is related to a number of birth defects. This includes fetal alcohol syndrome. All alcohol, in any form, should be avoided completely. Smoking will cause low birth rate and premature babies.  · Street/illegal drugs are very harmful to the baby. They are absolutely forbidden. A baby born to an addicted mother will be addicted at birth. The baby will go through the same withdrawal an adult does.  SEEK MEDICAL CARE IF:  You have any concerns or worries during your pregnancy. It is better to call with your questions if you feel they cannot wait, rather than worry about them.  DECISIONS ABOUT CIRCUMCISION  You may or may not know the sex of your baby. If you know your baby is a boy, it may be time to think about circumcision. Circumcision is the removal of the foreskin of the penis. This is the skin that covers the sensitive end of the penis. There is no proven medical need for this. Often this decision is made on what is popular at the time or based upon religious beliefs and social issues. You can discuss these issues with your caregiver or pediatrician.  SEEK IMMEDIATE MEDICAL CARE IF:   · An unexplained oral temperature above 102° F (38.9° C) develops, or as your caregiver suggests.  · You have leaking of fluid from the vagina (birth canal). If leaking membranes are suspected, take your temperature and tell your caregiver of this when you call.  · There is vaginal spotting, bleeding or passing clots. Tell your caregiver of the amount and how many pads are used.  · You develop a bad smelling vaginal discharge with  a change in the color from clear to white.  · You develop vomiting that lasts more than 24 hours.  · You develop chills or fever.  · You develop shortness of breath.  · You develop burning on urination.  · You loose more than 2 pounds of weight   or gain more than 2 pounds of weight or as suggested by your caregiver.  · You notice sudden swelling of your face, hands, and feet or legs.  · You develop belly (abdominal) pain. Round ligament discomfort is a common non-cancerous (benign) cause of abdominal pain in pregnancy. Your caregiver still must evaluate you.  · You develop a severe headache that does not go away.  · You develop visual problems, blurred or double vision.  · If you have not felt your baby move for more than 1 hour. If you think the baby is not moving as much as usual, eat something with sugar in it and lie down on your left side for an hour. The baby should move at least 4 to 5 times per hour. Call right away if your baby moves less than that.  · You fall, are in a car accident or any kind of trauma.  · There is mental or physical violence at home.  Document Released: 11/15/2001 Document Revised: 02/13/2012 Document Reviewed: 05/20/2009  [employer]® Patient Information ©2013 [employer].

## 2012-12-27 LAB — OBSTETRIC PANEL
Antibody Screen: NEGATIVE
Eosinophils Relative: 2 % (ref 0–5)
HCT: 32.6 % — ABNORMAL LOW (ref 36.0–46.0)
Lymphocytes Relative: 24 % (ref 12–46)
Lymphs Abs: 1.5 10*3/uL (ref 0.7–4.0)
MCH: 26.5 pg (ref 26.0–34.0)
MCV: 76.5 fL — ABNORMAL LOW (ref 78.0–100.0)
Monocytes Absolute: 0.7 10*3/uL (ref 0.1–1.0)
Monocytes Relative: 10 % (ref 3–12)
RBC: 4.26 MIL/uL (ref 3.87–5.11)
Rh Type: POSITIVE
Rubella: 3.9 Index — ABNORMAL HIGH (ref <0.90)
WBC: 6.3 10*3/uL (ref 4.0–10.5)

## 2012-12-27 LAB — HIV ANTIBODY (ROUTINE TESTING W REFLEX): HIV: NONREACTIVE

## 2012-12-29 ENCOUNTER — Inpatient Hospital Stay (HOSPITAL_COMMUNITY)
Admission: AD | Admit: 2012-12-29 | Discharge: 2013-01-01 | DRG: 775 | Disposition: A | Discharge status: Home / Self Care | Payer: Medicaid Other | Source: Ambulatory Visit | Attending: Obstetrics and Gynecology | Admitting: Obstetrics and Gynecology

## 2012-12-29 ENCOUNTER — Encounter (HOSPITAL_COMMUNITY): Payer: Self-pay | Admitting: *Deleted

## 2012-12-29 DIAGNOSIS — IMO0001 Reserved for inherently not codable concepts without codable children: Secondary | ICD-10-CM

## 2012-12-29 DIAGNOSIS — O0930 Supervision of pregnancy with insufficient antenatal care, unspecified trimester: Secondary | ICD-10-CM

## 2012-12-29 DIAGNOSIS — O135 Gestational [pregnancy-induced] hypertension without significant proteinuria, complicating the puerperium: Secondary | ICD-10-CM | POA: Diagnosis not present

## 2012-12-29 MED ORDER — LACTATED RINGERS IV SOLN
INTRAVENOUS | Status: DC
  Administered 2012-12-30 (×2): via INTRAVENOUS

## 2012-12-29 MED ORDER — OXYTOCIN BOLUS FROM INFUSION
500.0000 mL | INTRAVENOUS | Status: DC
  Administered 2012-12-30: 500 mL via INTRAVENOUS

## 2012-12-29 MED ORDER — FLEET ENEMA 7-19 GM/118ML RE ENEM: 1.0000 | ENEMA | RECTAL | Status: DC | PRN

## 2012-12-29 MED ORDER — LACTATED RINGERS IV SOLN: 500.0000 mL | INTRAVENOUS | Status: DC | PRN

## 2012-12-29 MED ORDER — CITRIC ACID-SODIUM CITRATE 334-500 MG/5ML PO SOLN: 30.0000 mL | ORAL | Status: DC | PRN

## 2012-12-29 MED ORDER — OXYCODONE-ACETAMINOPHEN 5-325 MG PO TABS: 1.0000 | ORAL_TABLET | ORAL | Status: DC | PRN

## 2012-12-29 MED ORDER — LIDOCAINE HCL (PF) 1 % IJ SOLN
30.0000 mL | INTRAMUSCULAR | Status: DC | PRN
  Filled 2012-12-29: qty 30

## 2012-12-29 MED ORDER — OXYTOCIN 40 UNITS IN LACTATED RINGERS INFUSION - SIMPLE MED
62.5000 mL/h | INTRAVENOUS | Status: DC
  Filled 2012-12-29: qty 1000

## 2012-12-29 MED ORDER — IBUPROFEN 600 MG PO TABS
600.0000 mg | ORAL_TABLET | Freq: Four times a day (QID) | ORAL | Status: DC | PRN
  Administered 2012-12-30: 600 mg via ORAL
  Filled 2012-12-29: qty 1

## 2012-12-29 MED ORDER — FENTANYL CITRATE 0.05 MG/ML IJ SOLN
100.0000 ug | INTRAMUSCULAR | Status: DC | PRN
  Administered 2012-12-30 (×2): 100 ug via INTRAVENOUS
  Filled 2012-12-29 (×2): qty 2

## 2012-12-29 MED ORDER — ONDANSETRON HCL 4 MG/2ML IJ SOLN: 4.0000 mg | Freq: Four times a day (QID) | INTRAMUSCULAR | Status: DC | PRN

## 2012-12-29 MED ORDER — ACETAMINOPHEN 325 MG PO TABS: 650.0000 mg | ORAL_TABLET | ORAL | Status: DC | PRN

## 2012-12-29 NOTE — MAU Note (Signed)
Pt G1 at 39.1wks having contractions every .  Denies leaking or bleeding.

## 2012-12-29 NOTE — MAU Note (Signed)
Genella Rife CNM notified of pt.  Admission orders rec'd.

## 2012-12-29 NOTE — H&P (Signed)
Amanda Beasley is a 22 y.o. G1P0 female at [redacted]w[redacted]d by LMP which correlates well w/ 36wk u/s, presenting w/ report of feeling gush of fluid around 0930 this am, then got up and trickled down legs but none since, uc's all day, but worse x 1hr.  Denies vb. Reports good fm.  Denies ha, scotomata, ruq/epigastric pain, n/v.  Late onset care @ [facility] @ 38.5wks w/ only 1 visit.  Had 1 visit to MAU, received OP U/S few days later on 12/26@ 36.4wks: vtx, afi wnl, efw 2336g/5lb2oz/45%, normal anatomy.  1hr glucola 93, gbs neg.  SVE 3d ago in office 0.5/th/-3.    Maternal Medical History:  Reason for admission: Reason for admission: contractions.  Contractions: Onset was 13-24 hours ago.   Frequency: regular.   Perceived severity is strong.    Fetal activity: Perceived fetal activity is normal.   Last perceived fetal movement was within the past hour.    Prenatal complications: Late care @ 38.5wks, only 1 visit  Prenatal Complications - Diabetes: none.    OB History    Grav Para Term Preterm Abortions TAB SAB Ect Mult Living   1              Past Medical History  Diagnosis Date  . No pertinent past medical history    Past Surgical History  Procedure Date  . No past surgeries    Family History: family history includes Cancer in her father; Diabetes in her maternal grandmother and mother; Heart disease in her mother; Hypertension in her mother; and Stroke in her mother. Social History:  reports that she quit smoking about 6 months ago. She has never used smokeless tobacco. She reports that she does not drink alcohol or use illicit drugs.   Review of Systems  Constitutional: Negative.  Negative for fever and chills.  HENT: Negative.   Eyes: Negative.   Respiratory: Negative.   Cardiovascular: Negative.   Gastrointestinal: Positive for abdominal pain (uc's).  Genitourinary: Negative.   Musculoskeletal: Negative.   Skin: Negative.   Neurological: Negative.   Endo/Heme/Allergies: Negative.    Psychiatric/Behavioral: Negative.     Dilation: 4.5 Effacement (%): 70 Station: -2 Exam by:: Rudi Coco RN Blood pressure 142/82, pulse 72, temperature 97.2 F (36.2 C), temperature source Oral, resp. rate 18, height 5\' 8"  (1.727 m), weight 80.74 kg (178 lb), last menstrual period 03/30/2012. Exam Physical Exam  Constitutional: She is oriented to person, place, and time. She appears well-developed and well-nourished.  HENT:  Head: Normocephalic.  Neck: Normal range of motion.  Cardiovascular: Normal rate and regular rhythm.   Respiratory: Effort normal and breath sounds normal.  GI: Soft.       Gravid   Genitourinary:       SVE by RN: 4.5/70/-2, w/ bloody show  Musculoskeletal: Normal range of motion.  Neurological: She is alert and oriented to person, place, and time. She has normal reflexes.       No clonus  Skin: Skin is warm and dry.  Psychiatric: She has a normal mood and affect. Her behavior is normal. Judgment and thought content normal.     FHR: 150, mod variability, 15x15accels, earlies=Cat I UCs: q  Prenatal labs: ABO, Rh: O/POS/-- (01/22 0940) Antibody: NEG (01/22 0940) Rubella: 3.90 (01/22 0940) RPR: NON REAC (01/22 0940)  HBsAg: NEGATIVE (01/22 0940)  HIV: NON REACTIVE (01/22 0940)  GBS: Negative (01/22 0000)  1hr glucola= 93  Assessment/Plan: A:  [redacted]w[redacted]d SIUP  Active labor  Cat I FHR  GBS neg  Late/limited pnc- only 1 visit at 38.5wks  Increased BPs  P:  Admit to BS  Expectant management  May have IV pain meds/epidural prn request  Anticipate NSVD  CBC, CMP, urine p/c ratio  UDS  Social work consult   Marge Duncans 12/29/2012, 11:38 PM

## 2012-12-30 ENCOUNTER — Inpatient Hospital Stay (HOSPITAL_COMMUNITY): Payer: Medicaid Other | Admitting: Anesthesiology

## 2012-12-30 ENCOUNTER — Encounter (HOSPITAL_COMMUNITY): Payer: Self-pay | Admitting: Anesthesiology

## 2012-12-30 ENCOUNTER — Encounter: Payer: Self-pay | Admitting: Family Medicine

## 2012-12-30 ENCOUNTER — Encounter (HOSPITAL_COMMUNITY): Payer: Self-pay | Admitting: Family Medicine

## 2012-12-30 DIAGNOSIS — O0930 Supervision of pregnancy with insufficient antenatal care, unspecified trimester: Secondary | ICD-10-CM

## 2012-12-30 DIAGNOSIS — O135 Gestational [pregnancy-induced] hypertension without significant proteinuria, complicating the puerperium: Secondary | ICD-10-CM

## 2012-12-30 LAB — COMPREHENSIVE METABOLIC PANEL
ALT: 9 U/L (ref 0–35)
Albumin: 2.7 g/dL — ABNORMAL LOW (ref 3.5–5.2)
Alkaline Phosphatase: 192 U/L — ABNORMAL HIGH (ref 39–117)
BUN: 7 mg/dL (ref 6–23)
Chloride: 102 mEq/L (ref 96–112)
Glucose, Bld: 82 mg/dL (ref 70–99)
Potassium: 3.5 mEq/L (ref 3.5–5.1)
Sodium: 137 mEq/L (ref 135–145)
Total Bilirubin: 0.2 mg/dL — ABNORMAL LOW (ref 0.3–1.2)
Total Protein: 6.3 g/dL (ref 6.0–8.3)

## 2012-12-30 LAB — CBC
Platelets: 271 10*3/uL (ref 150–400)
RBC: 4.21 MIL/uL (ref 3.87–5.11)
RDW: 13.6 % (ref 11.5–15.5)
WBC: 6.5 10*3/uL (ref 4.0–10.5)

## 2012-12-30 LAB — RAPID URINE DRUG SCREEN, HOSP PERFORMED
Amphetamines: NOT DETECTED
Opiates: NOT DETECTED
Tetrahydrocannabinol: NOT DETECTED

## 2012-12-30 LAB — RPR: RPR Ser Ql: NONREACTIVE

## 2012-12-30 MED ORDER — SENNOSIDES-DOCUSATE SODIUM 8.6-50 MG PO TABS
2.0000 | ORAL_TABLET | Freq: Every day | ORAL | Status: DC
  Administered 2012-12-30: 2 via ORAL

## 2012-12-30 MED ORDER — IBUPROFEN 600 MG PO TABS
600.0000 mg | ORAL_TABLET | Freq: Four times a day (QID) | ORAL | Status: DC
  Administered 2012-12-30 – 2013-01-01 (×9): 600 mg via ORAL
  Filled 2012-12-30 (×9): qty 1

## 2012-12-30 MED ORDER — DIBUCAINE 1 % RE OINT: 1.0000 "application " | TOPICAL_OINTMENT | RECTAL | Status: DC | PRN

## 2012-12-30 MED ORDER — FENTANYL 2.5 MCG/ML BUPIVACAINE 1/10 % EPIDURAL INFUSION (WH - ANES)
14.0000 mL/h | INTRAMUSCULAR | Status: DC
  Administered 2012-12-30: 14 mL/h via EPIDURAL
  Filled 2012-12-30: qty 125

## 2012-12-30 MED ORDER — TETANUS-DIPHTH-ACELL PERTUSSIS 5-2.5-18.5 LF-MCG/0.5 IM SUSP: 0.5000 mL | Freq: Once | INTRAMUSCULAR | Status: DC

## 2012-12-30 MED ORDER — SODIUM CHLORIDE 0.9 % IJ SOLN: 3.0000 mL | Freq: Two times a day (BID) | INTRAMUSCULAR | Status: DC

## 2012-12-30 MED ORDER — PHENYLEPHRINE 40 MCG/ML (10ML) SYRINGE FOR IV PUSH (FOR BLOOD PRESSURE SUPPORT)
80.0000 ug | PREFILLED_SYRINGE | INTRAVENOUS | Status: DC | PRN
  Filled 2012-12-30: qty 5

## 2012-12-30 MED ORDER — LIDOCAINE HCL (PF) 1 % IJ SOLN
INTRAMUSCULAR | Status: DC | PRN
  Administered 2012-12-30 (×4): 4 mL

## 2012-12-30 MED ORDER — SODIUM CHLORIDE 0.9 % IJ SOLN: 3.0000 mL | INTRAMUSCULAR | Status: DC | PRN

## 2012-12-30 MED ORDER — PRENATAL MULTIVITAMIN CH
1.0000 | ORAL_TABLET | Freq: Every day | ORAL | Status: DC
  Administered 2012-12-30 – 2013-01-01 (×3): 1 via ORAL
  Filled 2012-12-30 (×3): qty 1

## 2012-12-30 MED ORDER — DIPHENHYDRAMINE HCL 25 MG PO CAPS: 25.0000 mg | ORAL_CAPSULE | Freq: Four times a day (QID) | ORAL | Status: DC | PRN

## 2012-12-30 MED ORDER — SODIUM CHLORIDE 0.9 % IV SOLN: 250.0000 mL | INTRAVENOUS | Status: DC | PRN

## 2012-12-30 MED ORDER — BISACODYL 10 MG RE SUPP: 10.0000 mg | Freq: Every day | RECTAL | Status: DC | PRN

## 2012-12-30 MED ORDER — MEASLES, MUMPS & RUBELLA VAC ~~LOC~~ INJ
0.5000 mL | INJECTION | Freq: Once | SUBCUTANEOUS | Status: DC
  Filled 2012-12-30: qty 0.5

## 2012-12-30 MED ORDER — WITCH HAZEL-GLYCERIN EX PADS: 1.0000 "application " | MEDICATED_PAD | CUTANEOUS | Status: DC | PRN

## 2012-12-30 MED ORDER — DIPHENHYDRAMINE HCL 50 MG/ML IJ SOLN: 12.5000 mg | INTRAMUSCULAR | Status: DC | PRN

## 2012-12-30 MED ORDER — EPHEDRINE 5 MG/ML INJ
10.0000 mg | INTRAVENOUS | Status: DC | PRN
  Filled 2012-12-30: qty 4

## 2012-12-30 MED ORDER — EPHEDRINE 5 MG/ML INJ: 10.0000 mg | INTRAVENOUS | Status: DC | PRN

## 2012-12-30 MED ORDER — OXYTOCIN 40 UNITS IN LACTATED RINGERS INFUSION - SIMPLE MED: 62.5000 mL/h | INTRAVENOUS | Status: DC | PRN

## 2012-12-30 MED ORDER — LANOLIN HYDROUS EX OINT: TOPICAL_OINTMENT | CUTANEOUS | Status: DC | PRN

## 2012-12-30 MED ORDER — FLEET ENEMA 7-19 GM/118ML RE ENEM: 1.0000 | ENEMA | Freq: Every day | RECTAL | Status: DC | PRN

## 2012-12-30 MED ORDER — ZOLPIDEM TARTRATE 5 MG PO TABS: 5.0000 mg | ORAL_TABLET | Freq: Every evening | ORAL | Status: DC | PRN

## 2012-12-30 MED ORDER — ONDANSETRON HCL 4 MG/2ML IJ SOLN: 4.0000 mg | INTRAMUSCULAR | Status: DC | PRN

## 2012-12-30 MED ORDER — BENZOCAINE-MENTHOL 20-0.5 % EX AERO
1.0000 "application " | INHALATION_SPRAY | CUTANEOUS | Status: DC | PRN
  Administered 2012-12-31: 1 via TOPICAL
  Filled 2012-12-30: qty 56

## 2012-12-30 MED ORDER — PHENYLEPHRINE 40 MCG/ML (10ML) SYRINGE FOR IV PUSH (FOR BLOOD PRESSURE SUPPORT): 80.0000 ug | PREFILLED_SYRINGE | INTRAVENOUS | Status: DC | PRN

## 2012-12-30 MED ORDER — SIMETHICONE 80 MG PO CHEW: 80.0000 mg | CHEWABLE_TABLET | ORAL | Status: DC | PRN

## 2012-12-30 MED ORDER — OXYCODONE-ACETAMINOPHEN 5-325 MG PO TABS
1.0000 | ORAL_TABLET | ORAL | Status: DC | PRN
  Administered 2012-12-30: 1 via ORAL
  Filled 2012-12-30 (×2): qty 1

## 2012-12-30 MED ORDER — LACTATED RINGERS IV SOLN
500.0000 mL | Freq: Once | INTRAVENOUS | Status: AC
  Administered 2012-12-30: 500 mL via INTRAVENOUS

## 2012-12-30 MED ORDER — ONDANSETRON HCL 4 MG PO TABS: 4.0000 mg | ORAL_TABLET | ORAL | Status: DC | PRN

## 2012-12-30 NOTE — Clinical Social Work Maternal (Signed)
    Clinical Social Work Department PSYCHOSOCIAL ASSESSMENT - MATERNAL/CHILD 12/30/2012  Patient:  Amanda Beasley, Amanda Beasley  Account Number:  [id number]  Admit Date:  12/29/2012  Marjo Bicker Name:   Ok Anis L. Ray    Clinical Social Worker:  Nobie Putnam, LCSW   Date/Time:  12/30/2012 10:23 AM  Date Referred:  12/30/2012   Referral source  CN     Referred reason  [facility]   Other referral source:    I:  FAMILY / HOME ENVIRONMENT Child's legal guardian:  PARENT  Guardian - Name Guardian - Age Guardian - Address  Amanda Beasley [address]; [place], [place] [address]  Amanda Beasley 29    Other household support members/support persons Name Relationship DOB  Amanda Beasley MOTHER    Other support:   Family    II  PSYCHOSOCIAL DATA Information Source:  Patient Interview  Event organiser Employment:   Surveyor, quantity resources:  [employer] If Medicaid - County:  [employer] Other  Chemical engineer / Grade:   Maternity Care Coordinator / Child Services Coordination / Early Interventions:  Cultural issues impacting care:    III  STRENGTHS Strengths  Adequate Resources  Home prepared for Child (including basic supplies)  Supportive family/friends   Strength comment:    IV  RISK FACTORS AND CURRENT PROBLEMS Current Problem:  YES   Risk Factor & Current Problem Patient Issue Family Issue Risk Factor / Current Problem Comment  Other - See comment Y N 1 PNV    V  SOCIAL WORK ASSESSMENT CSW met with pt to assess reason for limited PNC.  Pt told CSW that she was in denial about the pregnancy, felt depressed and alone.  Once FOB learned about pregnancy, he ended their 2 year  relationship & was not supportive of pregnancy. Pt thought about terminating the pregnancy in the beginning but states she is happy she did not.  She smiled as she looked at the baby & stated "I'm in love with him."  She denies any depression symptoms now or SI history.  She is not interested in  any counseling options at this time.  CSW discussed the signs/symptoms of PP depression & encouraged her to seek medical attention if needed.    Pt admits to smoking MJ, 2 times a week during pregnancy, as it helped with nausea.  She has not smoked in a "couple months."  CSW explained hospital drug testing policy.  UDS & meconium results are pending.  She reports having all the necessary supplies for the infant and appears to be appropriate at this time.  Pt's mother is present at bedside and supportive.  CSW will continue to monitor drug screen results and make a referral if needed.      VI SOCIAL WORK PLAN Social Work Plan  No Further Intervention Required / No Barriers to Discharge   Type of pt/family education:   If child protective services report - county:   If child protective services report - date:   Information/referral to community resources comment:   Other social work plan:

## 2012-12-30 NOTE — Anesthesia Postprocedure Evaluation (Signed)
Anesthesia Post Note  Patient: Amanda Beasley  Procedure(s) Performed: * No procedures listed *  Anesthesia type: Epidural  Patient location: Mother/Baby  Post pain: Pain level controlled  Post assessment: Post-op Vital signs reviewed  Last Vitals:  Filed Vitals:   12/30/12 0900  BP: 134/87  Pulse: 87  Temp:   Resp: 18    Post vital signs: Reviewed  Level of consciousness:alert  Complications: No apparent anesthesia complications

## 2012-12-30 NOTE — Progress Notes (Signed)
Amanda Beasley is a 22 y.o. G1P0 at [redacted]w[redacted]d admitted for active labor  Subjective: Comfortable w/ epidural, no complaints.   Objective: BP 143/91  Pulse 71  Temp 97.9 F (36.6 C) (Oral)  Resp 20  Ht 5\' 8"  (1.727 m)  Wt 80.74 kg (178 lb)  BMI 27.06 kg/m2  SpO2 99%  LMP 03/30/2012      FHT:  FHR: 130 bpm, variability: min-mod,  accelerations:  Present,  decelerations:  Present earlies/variables UC:   q 2-50min SVE:   Dilation: 8 Effacement (%): 90 Station: 0 Exam by:: L. Dupell, RN  Labs: Lab Results  Component Value Date   WBC 6.5 12/30/2012   HGB 11.0* 12/30/2012   HCT 33.6* 12/30/2012   MCV 79.8 12/30/2012   PLT 271 12/30/2012    Assessment / Plan: Spontaneous labor, progressing normally  Labor: Progressing normally Preeclampsia:  Denies ha, scotomata, ruq/epigastric pain, n/v.   Fetal Wellbeing:  Category II Pain Control:  Epidural I/D:  n/a Anticipated MOD:  NSVD  Marge Duncans 12/30/2012, 3:59 AM

## 2012-12-30 NOTE — Anesthesia Preprocedure Evaluation (Signed)

## 2012-12-30 NOTE — Anesthesia Procedure Notes (Signed)
Epidural Patient location during procedure: OB Start time: 12/30/2012 3:03 AM  Staffing Performed by: anesthesiologist   Preanesthetic Checklist Completed: patient identified, site marked, surgical consent, pre-op evaluation, timeout performed, IV checked, risks and benefits discussed and monitors and equipment checked  Epidural Patient position: sitting Prep: site prepped and draped and DuraPrep Patient monitoring: continuous pulse ox and blood pressure Approach: midline Injection technique: LOR air  Needle:  Needle type: Tuohy  Needle gauge: 17 G Needle length: 9 cm and 9 Needle insertion depth: 5 cm cm Catheter type: closed end flexible Catheter size: 19 Gauge Catheter at skin depth: 10 cm Test dose: negative  Assessment Events: blood not aspirated, injection not painful, no injection resistance, negative IV test and no paresthesia  Additional Notes Discussed risk of headache, infection, bleeding, nerve injury and failed or incomplete block.  Patient voices understanding and wishes to proceed.  Epidural placed easily on first attempt.  No paresthesia.  Patient tolerated procedure well with no apparent complications.  Jasmine December, MDReason for block:procedure for pain

## 2012-12-30 NOTE — H&P (Signed)
Attestation of Attending Supervision of Advanced Practitioner (CNM/NP): Evaluation and management procedures were performed by the Advanced Practitioner under my supervision and collaboration.  I have reviewed the Advanced Practitioner's note and chart, and I agree with the management and plan.  Ryonna Cimini 12/30/2012 1:35 AM

## 2012-12-31 ENCOUNTER — Encounter: Payer: Self-pay | Admitting: Family Medicine

## 2012-12-31 MED ORDER — LABETALOL HCL 100 MG PO TABS
100.0000 mg | ORAL_TABLET | Freq: Two times a day (BID) | ORAL | Status: DC
  Administered 2012-12-31 – 2013-01-01 (×2): 100 mg via ORAL
  Filled 2012-12-31 (×2): qty 1

## 2012-12-31 NOTE — Progress Notes (Signed)
I have seen the patient and agree with the above.

## 2012-12-31 NOTE — Progress Notes (Signed)
UR chart review completed.  

## 2012-12-31 NOTE — Progress Notes (Signed)
Post Partum Day 1 Subjective: Amanda Beasley is a 22 y.o. G1P0 at [redacted]w[redacted]d admitted for active labor. At 5:32 AM a viable female was delivered via Vaginal, Spontaneous Delivery (Presentation:Right Occiput Anterior) en caul w/ srom immediately after birth w/ large amount of light MSF. APGAR:9/9 ;   Placenta status: Intact, Spontaneous, appears small. Cord: 3 vessels with the following complications: None. Cord pH: not done. No complaints, up ad lib, voiding, tolerating PO and + flatus  Objective: Blood pressure 150/84, pulse 88, temperature 98.4 F (36.9 C), temperature source Oral, resp. rate 20, height 5\' 8"  (1.727 m), weight 178 lb (80.74 kg), last menstrual period 03/30/2012, SpO2 98.00%, unknown if currently breastfeeding.  Physical Exam:  General: alert, cooperative and no distress Lochia: appropriate Uterine Fundus: firm Incision: healing well DVT Evaluation: No evidence of DVT seen on physical exam.   Basename 12/30/12  HGB 11.0*  HCT 33.6*    Assessment/Plan: Plan for discharge tomorrow.  Continue with current plan of care.  Pain control with PO pain meds as needed.  Pt up ad lib. Pump and bottle feed. Contraception discussed, still undecided,  Outpatient circumcision.   LOS: 2 days   William Schake 12/31/2012, 7:47 AM

## 2013-01-01 MED ORDER — IBUPROFEN 600 MG PO TABS: 600.0000 mg | ORAL_TABLET | Freq: Four times a day (QID) | ORAL | Status: DC

## 2013-01-01 MED ORDER — ENALAPRIL MALEATE 5 MG PO TABS: 5.0000 mg | ORAL_TABLET | Freq: Every day | ORAL | Status: DC

## 2013-01-01 MED ORDER — ENALAPRIL MALEATE 5 MG PO TABS
5.0000 mg | ORAL_TABLET | Freq: Every day | ORAL | Status: DC
  Filled 2013-01-01: qty 1

## 2013-01-01 NOTE — Progress Notes (Signed)
Spoke with Dr. Paulina Fusi regarding patient's BP at 0610 of 148/99. Patient asymptomatic, last had labetalol 100mg  po at 2200. Dr. Paulina Fusi states to give morning dose of labetalol now. Will continue to closely monitor patient.

## 2013-01-01 NOTE — Discharge Summary (Signed)
Obstetric Discharge Summary Reason for Admission: Amanda Beasley is a 22 y.o. G1P0 at [redacted]w[redacted]d admitted for active labor. At 5:32 AM a viable female was delivered via Vaginal, Spontaneous Delivery (Presentation:Right Occiput Anterior) en caul w/ srom immediately after birth w/ large amount of light MSF. APGAR:9/9 ; Placenta status: Intact, Spontaneous, appears small. Cord: 3 vessels with the following complications: None. Cord pH: not done. No complaints, up ad lib, voiding, tolerating PO and + flatus Prenatal Procedures: NST Intrapartum Procedures: spontaneous vaginal delivery Postpartum Procedures: none Complications-Operative and Postpartum: Hypertension Hemoglobin  Date Value Range Status  12/30/2012 11.0* 12.0 - 15.0 g/dL Final     HCT  Date Value Range Status  12/30/2012 33.6* 36.0 - 46.0 % Final    Physical Exam:  General: alert, cooperative and no distress Lochia: appropriate Uterine Fundus: firm Incision: healing well DVT Evaluation: No evidence of DVT seen on physical exam.  Discharge Diagnoses: Term Pregnancy-delivered  Discharge Information: Date: 01/01/2013 Activity: pelvic rest Diet: routine Medications: PNV and Ibuprofen, Depo for contraception Condition: stable Instructions: refer to practice specific booklet Discharge to: home Follow-up Information    Follow up with [facility]. Schedule an appointment as soon as possible for a visit in 6 weeks.   Contact information:   [address] [place] [address] [phone]         Newborn Data: Live born female  Birth Weight: 6 lb 2.2 oz (2784 g) APGAR: 9, 9  Home with mother. Circumcision in outpatient clinic  Henningsgaard, Elige Radon 01/01/2013, 7:40 AM  .I have seen the patient with the resident/student and agree with the above.

## 2013-01-02 ENCOUNTER — Encounter: Payer: Medicaid Other | Admitting: Advanced Practice Midwife

## 2013-01-02 NOTE — Discharge Summary (Signed)
Agree with above note.  LEGGETT,KELLY H. 01/02/2013 11:38 AM

## 2013-01-28 ENCOUNTER — Emergency Department (HOSPITAL_COMMUNITY)
Admission: EM | Admit: 2013-01-28 | Discharge: 2013-01-28 | Disposition: A | Discharge status: Home / Self Care | Payer: Medicaid Other | Attending: Emergency Medicine | Admitting: Emergency Medicine

## 2013-01-28 ENCOUNTER — Encounter (HOSPITAL_COMMUNITY): Payer: Self-pay | Admitting: *Deleted

## 2013-01-28 DIAGNOSIS — Z87891 Personal history of nicotine dependence: Secondary | ICD-10-CM | POA: Insufficient documentation

## 2013-01-28 DIAGNOSIS — Z79899 Other long term (current) drug therapy: Secondary | ICD-10-CM | POA: Insufficient documentation

## 2013-01-28 DIAGNOSIS — K0381 Cracked tooth: Secondary | ICD-10-CM | POA: Insufficient documentation

## 2013-01-28 DIAGNOSIS — K089 Disorder of teeth and supporting structures, unspecified: Secondary | ICD-10-CM | POA: Insufficient documentation

## 2013-01-28 DIAGNOSIS — K044 Acute apical periodontitis of pulpal origin: Secondary | ICD-10-CM | POA: Insufficient documentation

## 2013-01-28 MED ORDER — IBUPROFEN 600 MG PO TABS: 600.0000 mg | ORAL_TABLET | Freq: Three times a day (TID) | ORAL | Status: DC | PRN

## 2013-01-28 MED ORDER — IBUPROFEN 400 MG PO TABS
600.0000 mg | ORAL_TABLET | Freq: Once | ORAL | Status: AC
  Administered 2013-01-28: 600 mg via ORAL
  Filled 2013-01-28: qty 1

## 2013-01-28 MED ORDER — HYDROCODONE-ACETAMINOPHEN 5-325 MG PO TABS: 1.0000 | ORAL_TABLET | ORAL | Status: DC | PRN

## 2013-01-28 MED ORDER — OXYCODONE-ACETAMINOPHEN 5-325 MG PO TABS
1.0000 | ORAL_TABLET | Freq: Once | ORAL | Status: AC
  Administered 2013-01-28: 1 via ORAL
  Filled 2013-01-28: qty 1

## 2013-01-28 MED ORDER — PENICILLIN V POTASSIUM 500 MG PO TABS: 500.0000 mg | ORAL_TABLET | Freq: Four times a day (QID) | ORAL | Status: AC

## 2013-01-28 NOTE — ED Provider Notes (Signed)
History    This chart was scribed for Lyanne Co, MD by Gerlean Ren, ED Scribe. This patient was seen in room TR06C/TR06C and the patient's care was started at 10:58 PM    CSN: [id number]  Arrival date & time 01/28/13  2130   First MD Initiated Contact with Patient 01/28/13 2159      Chief Complaint  Patient presents with  . Dental Pain     The history is provided by the patient. No language interpreter was used.  Amanda Beasley is a 22 y.o. female who presents to the Emergency Department complaining of 2 days of left upper side dental pain due to a broken tooth with associated left side facial swelling.  Pt has dental appointment 03/04.  Pt not currently on any antibiotics.  No associated trauma.  No fever.    Past Medical History  Diagnosis Date  . No pertinent past medical history     Past Surgical History  Procedure Laterality Date  . No past surgeries      Family History  Problem Relation Age of Onset  . Diabetes Mother   . Hypertension Mother   . Stroke Mother   . Heart disease Mother   . Diabetes Maternal Grandmother   . Cancer Father     History  Substance Use Topics  . Smoking status: Former Smoker    Quit date: 06/26/2012  . Smokeless tobacco: Never Used  . Alcohol Use: No    OB History   Grav Para Term Preterm Abortions TAB SAB Ect Mult Living   1 1 1       1       Review of Systems A complete 10 system review of systems was obtained and all systems are negative except as noted in the HPI and PMH.   Allergies  Nickel  Home Medications   Current Outpatient Rx  Name  Route  Sig  Dispense  Refill  . enalapril (VASOTEC) 5 MG tablet   Oral   Take 1 tablet (5 mg total) by mouth daily.   30 tablet   1   . ibuprofen (ADVIL,MOTRIN) 600 MG tablet   Oral   Take 1 tablet (600 mg total) by mouth every 6 (six) hours.   30 tablet   1   . Prenatal Vit-Fe Fumarate-FA (PRENATAL MULTIVITAMIN) TABS   Oral   Take 1 tablet by mouth daily.            BP 143/90  Pulse 115  Temp(Src) 99 F (37.2 C) (Oral)  Resp 22  SpO2 98%  Physical Exam  Nursing note and vitals reviewed. Constitutional: She is oriented to person, place, and time. She appears well-developed and well-nourished.  HENT:  Head: Normocephalic.  Tenderness over second left upper molar, no gingival swelling, no gingival fluctuance  Eyes: EOM are normal.  Neck: Normal range of motion.  No lymphadenopathy.  Pulmonary/Chest: Effort normal.  Abdominal: She exhibits no distension.  Musculoskeletal: Normal range of motion.  Neurological: She is alert and oriented to person, place, and time.  Skin: Skin is warm.  Psychiatric: She has a normal mood and affect.    ED Course  Procedures (including critical care time) DIAGNOSTIC STUDIES: Oxygen Saturation is 98% on room air, normal by my interpretation.    COORDINATION OF CARE: 11:00 PM- Discussed antibiotics and pain medication but informed pt that keeping her dental appointment is absolutely necessary.  Pt understands and agrees.   1. Pain, dental  2. Dental infection       MDM  Dental Pain. Home with antibiotics and pain medicine. Recommend dental follow up. No signs of gingival abscess. Tolerating secretions. Airway patent. No sub lingular swelling    I personally performed the services described in this documentation, which was scribed in my presence. The recorded information has been reviewed and is accurate.         Lyanne Co, MD 01/28/13 [phone]

## 2013-01-28 NOTE — ED Notes (Signed)
Toothache and facial swelling for 2 days.  She has a dental appointment next week

## 2013-01-28 NOTE — ED Notes (Signed)
Patient is alert and orientedx4.  Patient was explained discharge instructions and they understood them with no questions.   

## 2013-01-30 ENCOUNTER — Ambulatory Visit: Payer: Medicaid Other | Admitting: Family Medicine

## 2013-01-30 ENCOUNTER — Encounter: Payer: Self-pay | Admitting: Obstetrics & Gynecology

## 2013-12-15 ENCOUNTER — Encounter (HOSPITAL_COMMUNITY): Payer: Self-pay | Admitting: Emergency Medicine

## 2013-12-15 ENCOUNTER — Emergency Department (HOSPITAL_COMMUNITY)
Admission: EM | Admit: 2013-12-15 | Discharge: 2013-12-15 | Disposition: A | Discharge status: Home / Self Care | Payer: Medicaid Other | Attending: Emergency Medicine | Admitting: Emergency Medicine

## 2013-12-15 ENCOUNTER — Emergency Department (HOSPITAL_COMMUNITY): Payer: Medicaid Other

## 2013-12-15 ENCOUNTER — Inpatient Hospital Stay (HOSPITAL_COMMUNITY)
Admission: EM | Admit: 2013-12-15 | Discharge: 2013-12-19 | DRG: 603 | Discharge status: Against Medical Advice | Payer: Medicaid Other | Attending: Internal Medicine | Admitting: Internal Medicine

## 2013-12-15 DIAGNOSIS — L03211 Cellulitis of face: Principal | ICD-10-CM | POA: Diagnosis present

## 2013-12-15 DIAGNOSIS — K08109 Complete loss of teeth, unspecified cause, unspecified class: Secondary | ICD-10-CM | POA: Insufficient documentation

## 2013-12-15 DIAGNOSIS — R609 Edema, unspecified: Secondary | ICD-10-CM | POA: Insufficient documentation

## 2013-12-15 DIAGNOSIS — K047 Periapical abscess without sinus: Secondary | ICD-10-CM

## 2013-12-15 DIAGNOSIS — F1210 Cannabis abuse, uncomplicated: Secondary | ICD-10-CM

## 2013-12-15 DIAGNOSIS — Z833 Family history of diabetes mellitus: Secondary | ICD-10-CM

## 2013-12-15 DIAGNOSIS — O0930 Supervision of pregnancy with insufficient antenatal care, unspecified trimester: Secondary | ICD-10-CM

## 2013-12-15 DIAGNOSIS — E860 Dehydration: Secondary | ICD-10-CM | POA: Diagnosis present

## 2013-12-15 DIAGNOSIS — Z823 Family history of stroke: Secondary | ICD-10-CM

## 2013-12-15 DIAGNOSIS — Z720 Tobacco use: Secondary | ICD-10-CM | POA: Diagnosis present

## 2013-12-15 DIAGNOSIS — Z87891 Personal history of nicotine dependence: Secondary | ICD-10-CM | POA: Insufficient documentation

## 2013-12-15 DIAGNOSIS — K029 Dental caries, unspecified: Secondary | ICD-10-CM | POA: Insufficient documentation

## 2013-12-15 DIAGNOSIS — F17200 Nicotine dependence, unspecified, uncomplicated: Secondary | ICD-10-CM | POA: Diagnosis present

## 2013-12-15 DIAGNOSIS — R599 Enlarged lymph nodes, unspecified: Secondary | ICD-10-CM | POA: Insufficient documentation

## 2013-12-15 DIAGNOSIS — N179 Acute kidney failure, unspecified: Secondary | ICD-10-CM | POA: Diagnosis present

## 2013-12-15 DIAGNOSIS — K0490 Unspecified diseases of pulp and periapical tissues: Secondary | ICD-10-CM | POA: Insufficient documentation

## 2013-12-15 DIAGNOSIS — F1290 Cannabis use, unspecified, uncomplicated: Secondary | ICD-10-CM

## 2013-12-15 DIAGNOSIS — Z8249 Family history of ischemic heart disease and other diseases of the circulatory system: Secondary | ICD-10-CM

## 2013-12-15 DIAGNOSIS — E876 Hypokalemia: Secondary | ICD-10-CM | POA: Diagnosis present

## 2013-12-15 DIAGNOSIS — L0201 Cutaneous abscess of face: Principal | ICD-10-CM | POA: Diagnosis present

## 2013-12-15 LAB — CBC
HCT: 34.9 % — ABNORMAL LOW (ref 36.0–46.0)
Hemoglobin: 11.8 g/dL — ABNORMAL LOW (ref 12.0–15.0)
MCH: 26.5 pg (ref 26.0–34.0)
MCHC: 33.8 g/dL (ref 30.0–36.0)
MCV: 78.3 fL (ref 78.0–100.0)
Platelets: 307 10*3/uL (ref 150–400)
RBC: 4.46 MIL/uL (ref 3.87–5.11)
RDW: 13.2 % (ref 11.5–15.5)
WBC: 13.2 10*3/uL — ABNORMAL HIGH (ref 4.0–10.5)

## 2013-12-15 LAB — BASIC METABOLIC PANEL
BUN: 7 mg/dL (ref 6–23)
CO2: 22 mEq/L (ref 19–32)
Calcium: 9.2 mg/dL (ref 8.4–10.5)
Chloride: 98 mEq/L (ref 96–112)
Creatinine, Ser: 0.72 mg/dL (ref 0.50–1.10)
GFR calc Af Amer: >90 mL/min (ref >90)
GFR calc non Af Amer: >90 mL/min (ref >90)
Glucose, Bld: 92 mg/dL (ref 70–99)
Potassium: 3.4 mEq/L — ABNORMAL LOW (ref 3.7–5.3)
Sodium: 136 mEq/L — ABNORMAL LOW (ref 137–147)

## 2013-12-15 MED ORDER — OXYCODONE-ACETAMINOPHEN 5-325 MG PO TABS: 1.0000 | ORAL_TABLET | ORAL | Status: DC | PRN

## 2013-12-15 MED ORDER — SODIUM CHLORIDE 0.9 % IJ SOLN: 3.0000 mL | INTRAMUSCULAR | Status: DC | PRN

## 2013-12-15 MED ORDER — MORPHINE SULFATE 2 MG/ML IJ SOLN
1.0000 mg | INTRAMUSCULAR | Status: DC | PRN
  Administered 2013-12-15 – 2013-12-18 (×11): 1 mg via INTRAVENOUS
  Filled 2013-12-15 (×11): qty 1

## 2013-12-15 MED ORDER — SODIUM CHLORIDE 0.9 % IV BOLUS (SEPSIS)
1000.0000 mL | INTRAVENOUS | Status: AC
  Administered 2013-12-15: 1000 mL via INTRAVENOUS

## 2013-12-15 MED ORDER — CLINDAMYCIN PHOSPHATE 900 MG/50ML IV SOLN
900.0000 mg | Freq: Once | INTRAVENOUS | Status: AC
  Administered 2013-12-15: 900 mg via INTRAVENOUS
  Filled 2013-12-15: qty 50

## 2013-12-15 MED ORDER — OXYCODONE HCL 5 MG PO TABS
5.0000 mg | ORAL_TABLET | ORAL | Status: DC | PRN
  Administered 2013-12-15 – 2013-12-18 (×12): 5 mg via ORAL
  Filled 2013-12-15 (×13): qty 1

## 2013-12-15 MED ORDER — ENOXAPARIN SODIUM 40 MG/0.4ML ~~LOC~~ SOLN
40.0000 mg | SUBCUTANEOUS | Status: DC
  Administered 2013-12-15 – 2013-12-18 (×4): 40 mg via SUBCUTANEOUS
  Filled 2013-12-15 (×4): qty 0.4

## 2013-12-15 MED ORDER — ONDANSETRON HCL 4 MG PO TABS: 4.0000 mg | ORAL_TABLET | Freq: Four times a day (QID) | ORAL | Status: DC | PRN

## 2013-12-15 MED ORDER — CLINDAMYCIN PHOSPHATE 900 MG/50ML IV SOLN
900.0000 mg | Freq: Three times a day (TID) | INTRAVENOUS | Status: DC
  Administered 2013-12-16 (×2): 900 mg via INTRAVENOUS
  Filled 2013-12-15 (×3): qty 50

## 2013-12-15 MED ORDER — ACETAMINOPHEN 650 MG RE SUPP: 650.0000 mg | Freq: Four times a day (QID) | RECTAL | Status: DC | PRN

## 2013-12-15 MED ORDER — CLINDAMYCIN PHOSPHATE 900 MG/50ML IV SOLN
900.0000 mg | Freq: Three times a day (TID) | INTRAVENOUS | Status: DC
  Filled 2013-12-15 (×2): qty 50

## 2013-12-15 MED ORDER — OXYCODONE-ACETAMINOPHEN 5-325 MG PO TABS
2.0000 | ORAL_TABLET | Freq: Once | ORAL | Status: AC
  Administered 2013-12-15: 2 via ORAL
  Filled 2013-12-15: qty 2

## 2013-12-15 MED ORDER — NAPROXEN 500 MG PO TABS: 500.0000 mg | ORAL_TABLET | Freq: Two times a day (BID) | ORAL | Status: DC

## 2013-12-15 MED ORDER — CEFTRIAXONE SODIUM 1 G IJ SOLR
1.0000 g | Freq: Once | INTRAMUSCULAR | Status: AC
  Administered 2013-12-15: 1 g via INTRAMUSCULAR
  Filled 2013-12-15: qty 10

## 2013-12-15 MED ORDER — SODIUM CHLORIDE 0.9 % IV SOLN
250.0000 mL | INTRAVENOUS | Status: DC | PRN
  Administered 2013-12-17: 500 mL via INTRAVENOUS

## 2013-12-15 MED ORDER — ONDANSETRON HCL 4 MG/2ML IJ SOLN
4.0000 mg | Freq: Once | INTRAMUSCULAR | Status: AC
  Administered 2013-12-15: 4 mg via INTRAVENOUS
  Filled 2013-12-15: qty 2

## 2013-12-15 MED ORDER — SODIUM CHLORIDE 0.9 % IJ SOLN
3.0000 mL | Freq: Two times a day (BID) | INTRAMUSCULAR | Status: DC
  Administered 2013-12-17 – 2013-12-18 (×2): 3 mL via INTRAVENOUS

## 2013-12-15 MED ORDER — ACETAMINOPHEN 325 MG PO TABS: 650.0000 mg | ORAL_TABLET | Freq: Four times a day (QID) | ORAL | Status: DC | PRN

## 2013-12-15 MED ORDER — MORPHINE SULFATE 4 MG/ML IJ SOLN
6.0000 mg | Freq: Once | INTRAMUSCULAR | Status: AC
  Administered 2013-12-15: 6 mg via INTRAVENOUS
  Filled 2013-12-15: qty 2

## 2013-12-15 MED ORDER — AMOXICILLIN-POT CLAVULANATE 875-125 MG PO TABS: 1.0000 | ORAL_TABLET | Freq: Two times a day (BID) | ORAL | Status: DC

## 2013-12-15 MED ORDER — ONDANSETRON HCL 4 MG/2ML IJ SOLN
4.0000 mg | Freq: Four times a day (QID) | INTRAMUSCULAR | Status: DC | PRN
  Administered 2013-12-17 (×2): 4 mg via INTRAVENOUS
  Filled 2013-12-15 (×2): qty 2

## 2013-12-15 MED ORDER — IOHEXOL 300 MG/ML  SOLN
100.0000 mL | Freq: Once | INTRAMUSCULAR | Status: AC | PRN
  Administered 2013-12-15: 100 mL via INTRAVENOUS

## 2013-12-15 NOTE — ED Notes (Addendum)
Pt states that she was here today and dx w/ a dental infection.  Pt now has massive facial edema.  States that she threw up all of the abx.

## 2013-12-15 NOTE — ED Provider Notes (Signed)
CSN: [id number]     Arrival date & time 12/15/13  0605 History   First MD Initiated Contact with Patient 12/15/13 0615     Chief Complaint  Patient presents with  . Dental Pain   (Consider location/radiation/quality/duration/timing/severity/associated sxs/prior Treatment) HPI Comments: 23 year old female, no history of diabetes or HIV or immunocompromised. She presents with a complaint of right-sided facial pain which developed overnight. She had been having dental pain in the right upper teeth and is known to have poor dentition, missing teeth and multiple fractured and deeply carried teeth. Over the last 12 hours she has developed swelling in the right maxillary area, gradually worsening, pain is severe, worse with palpation but not associated with fevers nausea or vomiting. She does not have a dentist.  Patient is a 23 y.o. female presenting with tooth pain. The history is provided by the patient.  Dental Pain Associated symptoms: no facial swelling and no fever     Past Medical History  Diagnosis Date  . No pertinent past medical history    Past Surgical History  Procedure Laterality Date  . No past surgeries     Family History  Problem Relation Age of Onset  . Diabetes Mother   . Hypertension Mother   . Stroke Mother   . Heart disease Mother   . Diabetes Maternal Grandmother   . Cancer Father    History  Substance Use Topics  . Smoking status: Former Smoker    Quit date: 06/26/2012  . Smokeless tobacco: Never Used  . Alcohol Use: No   OB History   Grav Para Term Preterm Abortions TAB SAB Ect Mult Living   1 1 1       1      Review of Systems  Constitutional: Negative for fever and chills.  HENT: Positive for dental problem. Negative for facial swelling, sore throat, trouble swallowing and voice change.        Toothache  Gastrointestinal: Negative for nausea and vomiting.    Allergies  Nickel  Home Medications   No current outpatient prescriptions on  file. BP 137/70  Pulse 73  Temp(Src) 99.2 F (37.3 C) (Oral)  Resp 18  SpO2 100%  LMP 12/13/2013 Physical Exam  Nursing note and vitals reviewed. Constitutional: She appears well-developed and well-nourished. No distress.  HENT:  Head: Normocephalic and atraumatic.  Mouth/Throat: Oropharynx is clear and moist. No oropharyngeal exudate.  Dental Disease - old missing teeth, multiple deeply carried and fractured teeth, no tenderness over the lower teeth or the left upper teeth, no tenderness underneath the tongue to palpation, no elevation of the tongue, no change in phonation, no trismus or torticollis. There is tenderness on the right upper gum line but no definite palpable fluctuant mass. There is tenderness over the zygomatic maxillary area of the right face and cheek, no discrete palpable abscess though there is asymmetry of the face. Nasal passages are clear  Eyes: Conjunctivae are normal. No scleral icterus.  Neck: Normal range of motion. Neck supple. No thyromegaly present.  Cardiovascular: Normal rate and regular rhythm.   Pulmonary/Chest: Effort normal and breath sounds normal.  Lymphadenopathy:    She has cervical adenopathy ( Single centimeter shotty minimally tender mobile rubbery lymphadenopathy of the anterior cervical chain on the right).  Neurological: She is alert.  Skin: Skin is warm and dry. No rash noted. She is not diaphoretic.    ED Course  Procedures (including critical care time) Labs Review Labs Reviewed - No data to display  Imaging Review Ct Maxillofacial W/cm  12/15/2013   CLINICAL DATA:  23 year old female with right maxillary and facial pain and swelling.  EXAM: CT MAXILLOFACIAL WITH CONTRAST  TECHNIQUE: Multidetector CT imaging of the maxillofacial structures was performed with intravenous contrast. Multiplanar CT image reconstructions were also generated. A small metallic BB was placed on the right temple in order to reliably differentiate right from left.   CONTRAST:  OMNIPAQUE IOHEXOL 300 MG/ML  SOLN  COMPARISON:  None.  FINDINGS: There are multiple dental caries and periapical abscesses, which include teeth #2-6, 14, 15, 19, 30 and 32. There is moderate right facial soft tissue swelling without discrete soft tissue abscess. The periapical abscess of tooth #5 appears to have cortical breakthrough and likely the cause of right facial soft tissue swelling/ cellulitis. There is no evidence of fracture, subluxation or dislocation.  No other focal bony abnormalities are noted.  Mild mucosal thickening within the right maxillary sinus is noted.  Orbits and globes are unremarkable. There is no evidence of postseptal or intraconal abnormality.  The mastoid air cells and inner/ middle ears are clear.  IMPRESSION: Cortical breakthrough of tooth #5 periapical abscess causing right facial soft tissue swelling/ cellulitis. No discrete soft tissue abscess.  Multiple dental caries and periapical abscesses as discussed above.   Electronically Signed   By: Laveda Abbe M.D.   On: 12/15/2013 07:46    EKG Interpretation   None       MDM   1. Dental infection    The patient has dental disease, likely has a dental-related infection of the right maxilla. No discrete drainable abscess but has significant swelling of the cheek, CT pending to rule out deeper abscess, antibiotics ordered, pain medicines, airway stable, vital signs stable, patient will likely be discharged on antibiotics. No penicillin allergy, Augmentin for home.  CT read as negative for facial or maxillary abscess though there is periapical abscess, abx as below - stable for d/c.  Meds given in ED:  Medications  cefTRIAXone (ROCEPHIN) injection 1 g (1 g Intramuscular Given 12/15/13 0702)  oxyCODONE-acetaminophen (PERCOCET/ROXICET) 5-325 MG per tablet 2 tablet (2 tablets Oral Given 12/15/13 0653)  iohexol (OMNIPAQUE) 300 MG/ML solution 100 mL (100 mLs Intravenous Contrast Given 12/15/13 0710)     Discharge Medication List as of 12/15/2013  7:34 AM    START taking these medications   Details  naproxen (NAPROSYN) 500 MG tablet Take 1 tablet (500 mg total) by mouth 2 (two) times daily with a meal., Starting 12/15/2013, Until Discontinued, Print    oxyCODONE-acetaminophen (PERCOCET) 5-325 MG per tablet Take 1 tablet by mouth every 4 (four) hours as needed., Starting 12/15/2013, Until Discontinued, Print    amoxicillin-clavulanate (AUGMENTIN) 875-125 MG per tablet Take 1 tablet by mouth every 12 (twelve) hours., Starting 12/15/2013, Until Discontinued, Print            Vida Roller, MD 12/16/13 [phone]

## 2013-12-15 NOTE — Discharge Instructions (Signed)
Take Augmentin twice daily, use the pain medicines as needed - call the Dentist above today if you don't have a dentist to make appointment for Monday or Tuesday - return to the ER for severe or worsening pain or swelling.  Please call your doctor for a followup appointment within 24-48 hours. When you talk to your doctor please let them know that you were seen in the emergency department and have them acquire all of your records so that they can discuss the findings with you and formulate a treatment plan to fully care for your new and ongoing problems.

## 2013-12-15 NOTE — ED Provider Notes (Signed)
CSN: [id number]     Arrival date & time 12/15/13  1644 History   First MD Initiated Contact with Patient 12/15/13 1703     Chief Complaint  Patient presents with  . Facial Swelling   (Consider location/radiation/quality/duration/timing/severity/associated sxs/prior Treatment) HPI Pt is a 23yo female with hx of poor dentition, seen earlier this morning for right sided facial swelling, pain, and dental pain. She was dx with a periapical abscess, discharged home with augmentin and advised to f/u with dentist.  Pt returned this afternoon for same, stating swelling in right side of face continued after discharge from ED and prior to taking new antibiotic. States pain and swelling is significantly worse, throbbing, aching, 10/10, and swelling has caused her eye to swell shut.  Pt states she feels like there is more fluid in her face now.  She has taken 1 dose of her Augmentin around 1pm w/o relief.  Also c/o nausea and vomiting x3. No known drug allergies.  Reviewed medical records, pt also received 1g IM rocephin when she was seen earlier this morning for same.  Past Medical History  Diagnosis Date  . No pertinent past medical history    Past Surgical History  Procedure Laterality Date  . No past surgeries     Family History  Problem Relation Age of Onset  . Diabetes Mother   . Hypertension Mother   . Stroke Mother   . Heart disease Mother   . Diabetes Maternal Grandmother   . Cancer Father    History  Substance Use Topics  . Smoking status: Former Smoker    Quit date: 06/26/2012  . Smokeless tobacco: Never Used  . Alcohol Use: No   OB History   Grav Para Term Preterm Abortions TAB SAB Ect Mult Living   1 1 1       1      Review of Systems  Constitutional: Positive for chills. Negative for fever.  HENT: Positive for dental problem and facial swelling ( right). Negative for sore throat, trouble swallowing and voice change.   Gastrointestinal: Positive for nausea and vomiting.  Negative for abdominal pain.  Neurological: Positive for headaches.  All other systems reviewed and are negative.    Allergies  Nickel  Home Medications   Current Outpatient Rx  Name  Route  Sig  Dispense  Refill  . acetaminophen (TYLENOL) 500 MG tablet   Oral   Take 1,000 mg by mouth every 6 (six) hours as needed for pain.         Marland Kitchen amoxicillin-clavulanate (AUGMENTIN) 875-125 MG per tablet   Oral   Take 1 tablet by mouth every 12 (twelve) hours.   14 tablet   0   . ibuprofen (ADVIL,MOTRIN) 200 MG tablet   Oral   Take 200 mg by mouth every 6 (six) hours as needed.         . naproxen (NAPROSYN) 500 MG tablet   Oral   Take 1 tablet (500 mg total) by mouth 2 (two) times daily with a meal.   30 tablet   0   . oxyCODONE-acetaminophen (PERCOCET) 5-325 MG per tablet   Oral   Take 1 tablet by mouth every 4 (four) hours as needed.   20 tablet   0    BP 148/88  Pulse 92  Temp(Src) 99 F (37.2 C) (Oral)  Resp 16  SpO2 98%  LMP 12/13/2013 Physical Exam  Nursing note and vitals reviewed. Constitutional: She appears well-developed and well-nourished. No distress.  HENT:  Head: Normocephalic and atraumatic.  Nose: Nose normal.  Mouth/Throat: Uvula is midline, oropharynx is clear and moist and mucous membranes are normal. Oral lesions present. No trismus in the jaw. Abnormal dentition. Dental abscesses and dental caries present. No uvula swelling.    Moderate to significant right facial edema, erythema with induration over right maxillary sinus with tenderness. Right eye swollen shut. No discharge.     Multiple dental caries and dental decay. No obvious drainable dental abscess.   Eyes: Conjunctivae are normal. No scleral icterus.  Neck: Normal range of motion. Neck supple. No JVD present. No tracheal deviation present. No thyromegaly present.  Cardiovascular: Normal rate, regular rhythm and normal heart sounds.   Pulmonary/Chest: Effort normal and breath sounds  normal. No stridor. No respiratory distress. She has no wheezes. She has no rales. She exhibits no tenderness.  Abdominal: Soft. Bowel sounds are normal. She exhibits no distension and no mass. There is no tenderness. There is no rebound and no guarding.  Musculoskeletal: Normal range of motion.  Lymphadenopathy:    She has cervical adenopathy.  Neurological: She is alert.  Skin: Skin is warm and dry. She is not diaphoretic.    ED Course  Procedures (including critical care time) Labs Review Labs Reviewed  CBC - Abnormal; Notable for the following:    WBC 13.2 (*)    Hemoglobin 11.8 (*)    HCT 34.9 (*)    All other components within normal limits  BASIC METABOLIC PANEL - Abnormal; Notable for the following:    Sodium 136 (*)    Potassium 3.4 (*)    All other components within normal limits   Imaging Review Ct Maxillofacial W/cm  12/15/2013   CLINICAL DATA:  23 year old female with right maxillary and facial pain and swelling.  EXAM: CT MAXILLOFACIAL WITH CONTRAST  TECHNIQUE: Multidetector CT imaging of the maxillofacial structures was performed with intravenous contrast. Multiplanar CT image reconstructions were also generated. A small metallic BB was placed on the right temple in order to reliably differentiate right from left.  CONTRAST:  OMNIPAQUE IOHEXOL 300 MG/ML  SOLN  COMPARISON:  None.  FINDINGS: There are multiple dental caries and periapical abscesses, which include teeth #2-6, 14, 15, 19, 30 and 32. There is moderate right facial soft tissue swelling without discrete soft tissue abscess. The periapical abscess of tooth #5 appears to have cortical breakthrough and likely the cause of right facial soft tissue swelling/ cellulitis. There is no evidence of fracture, subluxation or dislocation.  No other focal bony abnormalities are noted.  Mild mucosal thickening within the right maxillary sinus is noted.  Orbits and globes are unremarkable. There is no evidence of postseptal or  intraconal abnormality.  The mastoid air cells and inner/ middle ears are clear.  IMPRESSION: Cortical breakthrough of tooth #5 periapical abscess causing right facial soft tissue swelling/ cellulitis. No discrete soft tissue abscess.  Multiple dental caries and periapical abscesses as discussed above.   Electronically Signed   By: Laveda Abbe M.D.   On: 12/15/2013 07:46    EKG Interpretation   None       MDM   1. Periapical abscess with facial involvement    Pt is a 23yo female dx earlier this morning with periapical abscess c/o increased pain and swelling in right side of face since being discharged from ED this morning. Pt received 1g  IM rocephin and d/c home with augmentin, which she took first dose around 1pm this afternoon w/o  relief.    Will start pt on IV clindamycin, get basic labs: CBC and BMP, tx pain and nausea: morphine and zofran.  Discussed pt with Dr. Wilkie AyeHorton, will consult hospitalist to have pt admitted for observation and IV antibiotics   Consulted with Dr. Virginia RochesterSendil Krishnan, will admit pt to observation, med-surg for further IV antibiotics.    Junius Finnerrin O'Malley, PA-C 12/15/13 1849

## 2013-12-15 NOTE — ED Notes (Signed)
Pt arrived to the ED with a complaint of dental pain.  Pt states her pain is located on the right hand upper medial.  Pt states that the pain has caused her face to swell since around 0130 this AM.  Pt's face is notably swollen.

## 2013-12-15 NOTE — ED Notes (Signed)
Pt presents with right sided facial swelling. Pt was seen earlier today for dental infection and since then her swelling has significantly increased. Swelling increased to the point that pt can barely see out of her right eye.

## 2013-12-15 NOTE — ED Notes (Signed)
Bed: [id number] Expected date:  Expected time:  Means of arrival:  Comments: Dental pain

## 2013-12-15 NOTE — Progress Notes (Signed)
Pt stated that something 'gushed' in her mouth.  Small amount of blood and brown liquid came out of her mouth.  Attached suction in her room, had her rinse with water.  Discharge stopped.  Suction left at bedside and pt told to tell RN if it happens again. Will continue to monitor.  Sherron MondayGood, Maura Braaten L

## 2013-12-16 ENCOUNTER — Encounter (HOSPITAL_COMMUNITY): Payer: Self-pay | Admitting: General Practice

## 2013-12-16 DIAGNOSIS — L03211 Cellulitis of face: Principal | ICD-10-CM

## 2013-12-16 DIAGNOSIS — E876 Hypokalemia: Secondary | ICD-10-CM

## 2013-12-16 DIAGNOSIS — L0201 Cutaneous abscess of face: Principal | ICD-10-CM

## 2013-12-16 DIAGNOSIS — F1290 Cannabis use, unspecified, uncomplicated: Secondary | ICD-10-CM | POA: Diagnosis present

## 2013-12-16 DIAGNOSIS — Z720 Tobacco use: Secondary | ICD-10-CM | POA: Diagnosis present

## 2013-12-16 DIAGNOSIS — K047 Periapical abscess without sinus: Secondary | ICD-10-CM

## 2013-12-16 LAB — BASIC METABOLIC PANEL
BUN: 7 mg/dL (ref 6–23)
CO2: 27 mEq/L (ref 19–32)
CREATININE: 0.79 mg/dL (ref 0.50–1.10)
Calcium: 8.4 mg/dL (ref 8.4–10.5)
Chloride: 100 mEq/L (ref 96–112)
GLUCOSE: 94 mg/dL (ref 70–99)
Potassium: 3.4 mEq/L — ABNORMAL LOW (ref 3.7–5.3)
Sodium: 138 mEq/L (ref 137–147)

## 2013-12-16 LAB — MRSA PCR SCREENING: MRSA BY PCR: NEGATIVE

## 2013-12-16 LAB — CBC
HEMATOCRIT: 31.1 % — AB (ref 36.0–46.0)
HEMOGLOBIN: 10.1 g/dL — AB (ref 12.0–15.0)
MCH: 26 pg (ref 26.0–34.0)
MCHC: 32.5 g/dL (ref 30.0–36.0)
MCV: 80.2 fL (ref 78.0–100.0)
Platelets: 293 10*3/uL (ref 150–400)
RBC: 3.88 MIL/uL (ref 3.87–5.11)
RDW: 13.4 % (ref 11.5–15.5)
WBC: 11.4 10*3/uL — ABNORMAL HIGH (ref 4.0–10.5)

## 2013-12-16 MED ORDER — VANCOMYCIN HCL IN DEXTROSE 1-5 GM/200ML-% IV SOLN
1000.0000 mg | Freq: Three times a day (TID) | INTRAVENOUS | Status: DC
  Administered 2013-12-16 – 2013-12-17 (×4): 1000 mg via INTRAVENOUS
  Filled 2013-12-16 (×6): qty 200

## 2013-12-16 MED ORDER — PIPERACILLIN-TAZOBACTAM 3.375 G IVPB
3.3750 g | Freq: Three times a day (TID) | INTRAVENOUS | Status: DC
  Administered 2013-12-16 – 2013-12-19 (×9): 3.375 g via INTRAVENOUS
  Filled 2013-12-16 (×11): qty 50

## 2013-12-16 MED ORDER — LORAZEPAM 2 MG/ML IJ SOLN
1.0000 mg | Freq: Two times a day (BID) | INTRAMUSCULAR | Status: DC | PRN
  Administered 2013-12-16 – 2013-12-17 (×2): 1 mg via INTRAVENOUS
  Filled 2013-12-16 (×2): qty 1

## 2013-12-16 NOTE — H&P (Signed)
[facility] History and Physical  Amanda Beasley WJX:[id number] DOB: 1991/11/28 DOA: 12/15/2013  Referring physician: Junius Finner, Jocelyn Lamer PCP: Patient does not have a primary care physician  Chief Complaint: Facial swelling  HPI: Amanda Beasley is a 23 y.o. female  Past history tobacco abuse who has stopped going to the dentist a year or so ago after she lost her insurance. Patient noted in the last 2 days she started have some mild facial swelling and tooth pain. Patient to come to the emergency room in the early morning hours of 1/11 and sent home with by mouth antibiotics and medication for pain, however she woke up this morning with severe facial swelling, pushing her right eye closed and she felt so sick, she is unable to tolerate by mouth antibiotics. She came back into the emergency room and was noted to have a mild leukocytosis. CT maxillofacial was done which noted multiple dental caries and periapical abscess ease on a number of teeth, with notable cortical breakthrough on one tooth felt to be the cause of right facial soft tissue swelling and cellulitis without any post septal or intraconal or orbital involvement. Patient was given dose of IV clindamycin and hospitalists were called for further evaluation and admission.   Review of Systems:  Patient seen after arrival to floor. Doing okay, other than right facial swelling and some tenderness. Difficulty chewing. Difficulty opening her right eye. Denies any headaches. She does not really feel like her swallowing is affected. Denies any chest pain, palpitations, shortness of breath, wheeze, cough, abdominal pain, hematuria, dysuria, constipation, diarrhea, focal extremity numbness or weakness or pain. Review systems otherwise negative.  Past Medical History  Diagnosis Date  Tobacco abuse      Past Surgical History  Procedure Laterality Date  . No past surgeries     Social History:  . She has never used smokeless  tobacco. She reports that she does not drink alcohol. She occasionally smokes regular cigarettes as well as marijuana. Patient was at home with her mother. She is normal able to ambulate and participate in all activities of daily living without assistance.  Allergies  Allergen Reactions  . Nickel Rash    Family History  Problem Relation Age of Onset  . Diabetes Mother   . Hypertension Mother   . Stroke Mother   . Heart disease Mother   . Diabetes Maternal Grandmother   . Cancer Father      Prior to Admission medications   Medication Sig Start Date End Date Taking? Authorizing Provider  acetaminophen (TYLENOL) 500 MG tablet Take 1,000 mg by mouth every 6 (six) hours as needed for pain.   Yes Historical Provider, MD  ibuprofen (ADVIL,MOTRIN) 200 MG tablet Take 200 mg by mouth every 6 (six) hours as needed.   Yes Historical Provider, MD  naproxen (NAPROSYN) 500 MG tablet Take 1 tablet (500 mg total) by mouth 2 (two) times daily with a meal. 12/15/13  Yes Vida Roller, MD  oxyCODONE-acetaminophen (PERCOCET) 5-325 MG per tablet Take 1 tablet by mouth every 4 (four) hours as needed. 12/15/13  Yes Vida Roller, MD   Physical Exam: Filed Vitals:   12/15/13 2100  BP: 132/81  Pulse: 86  Temp: 98.8 F (37.1 C)  Resp: 16    BP 132/81  Pulse 86  Temp(Src) 98.8 F (37.1 C) (Oral)  Resp 16  Ht 5\' 8"  (1.727 m)  Wt 64.365 kg (141 lb 14.4 oz)  BMI 21.58 kg/m2  SpO2 100%  LMP 12/13/2013  General:  alert and oriented x3, no acute distress HEENT: Right side of face from cheek extending up to the right maxillary area is mildly erythematous and swollen and tender. Cranial nerves II through XII appear to be intact. Mucous membranes are slightly dry.  Cardiovascular: Regular rate and rhythm, S1-S2 Lungs: Clear to auscultation bilaterally Abdomen: Soft, nontender, nondistended, positive bowel sounds Extremities: No clubbing or cyanosis or edema Psych: Patient is appropriate, no evidence of  psychoses Neuro: No focal deficits           Labs on Admission:  Basic Metabolic Panel:  Recent Labs Lab 12/15/13 1750  NA 136*  K 3.4*  CL 98  CO2 22  GLUCOSE 92  BUN 7  CREATININE 0.72  CALCIUM 9.2   Liver Function Tests: No results found for this basename: AST, ALT, ALKPHOS, BILITOT, PROT, ALBUMIN,  in the last 168 hours No results found for this basename: LIPASE, AMYLASE,  in the last 168 hours No results found for this basename: AMMONIA,  in the last 168 hours CBC:  Recent Labs Lab 12/15/13 1750  WBC 13.2*  HGB 11.8*  HCT 34.9*  MCV 78.3  PLT 307   Cardiac Enzymes: No results found for this basename: CKTOTAL, CKMB, CKMBINDEX, TROPONINI,  in the last 168 hours  BNP (last 3 results) No results found for this basename: PROBNP,  in the last 8760 hours CBG: No results found for this basename: GLUCAP,  in the last 168 hours  Radiological Exams on Admission: Ct Maxillofacial W/cm  12/15/2013   CLINICAL DATA:  23 year old female with right maxillary and facial pain and swelling.  EXAM: CT MAXILLOFACIAL WITH CONTRAST  TECHNIQUE: Multidetector CT imaging of the maxillofacial structures was performed with intravenous contrast. Multiplanar CT image reconstructions were also generated. A small metallic BB was placed on the right temple in order to reliably differentiate right from left.  CONTRAST:  OMNIPAQUE IOHEXOL 300 MG/ML  SOLN  COMPARISON:  None.  FINDINGS: There are multiple dental caries and periapical abscesses, which include teeth #2-6, 14, 15, 19, 30 and 32. There is moderate right facial soft tissue swelling without discrete soft tissue abscess. The periapical abscess of tooth #5 appears to have cortical breakthrough and likely the cause of right facial soft tissue swelling/ cellulitis. There is no evidence of fracture, subluxation or dislocation.  No other focal bony abnormalities are noted.  Mild mucosal thickening within the right maxillary sinus is noted.   Orbits and globes are unremarkable. There is no evidence of postseptal or intraconal abnormality.  The mastoid air cells and inner/ middle ears are clear.  IMPRESSION: Cortical breakthrough of tooth #5 periapical abscess causing right facial soft tissue swelling/ cellulitis. No discrete soft tissue abscess.  Multiple dental caries and periapical abscesses as discussed above.   Electronically Signed   By: Laveda Abbe M.D.   On: 12/15/2013 07:46     Assessment/Plan Principal Problem:   Facial cellulitis: Swelling to the point where she cannot tolerate by mouth antibiotics. Will use IV clindamycin. Recheck white blood cell count by morning, she should be status post 2 doses of IV antibiotics by then, and has for white count is worse, changed to IV vancomycin. Otherwise, symptoms should be improved by sometime tomorrow, but she could possibly go home. Active Problems:   Periapical abscess with facial involvement: Will need outpatient followup with dentistry. Patient stopped going to a dentist after she lost her insurance. We'll try to  get her a referral   Tobacco abuse: Declined nicotine patch   Marijuana use Hypokalemia: Mild. Likely secondary to dehydration. Replacing.   Code Status: Full code  Family Communication: Friend at the bedside.  Disposition Plan: Home possibly tomorrow  Time spent: 30 minutes  Hollice EspyKRISHNAN,Callan Yontz K [facility] Pager [phone]

## 2013-12-16 NOTE — Progress Notes (Signed)
UR completed. Patient changed to inpatient r/t requiring IV antibiotics.  

## 2013-12-16 NOTE — ED Provider Notes (Signed)
Medical screening examination/treatment/procedure(s) were performed by non-physician practitioner and as supervising physician I was immediately available for consultation/collaboration.  EKG Interpretation    Date/Time:    Ventricular Rate:    PR Interval:    QRS Duration:   QT Interval:    QTC Calculation:   R Axis:     Text Interpretation:               Shon Batonourtney F Horton, MD 12/16/13 1003

## 2013-12-16 NOTE — Progress Notes (Signed)
Patient ID: Amanda GrahamCetaria C Felch, female   DOB: 12-19-1990, 23 y.o.   MRN: [id number]  [facility] PROGRESS NOTE  Amanda GrahamCetaria C Vogler WUJ:[id number] DOB: 12-19-1990 DOA: 12/15/2013 PCP: Default, Provider, MD  Brief narrative: 23 y.o. female who [facility] with main concern of progressively worsening right facial swelling, erythema, constant throbbing pain, 10/10 in severity. Swelling is now extending to the eye area and even to the left side of the face. This has been associated with fevers, chills, poor oral intake. Pt explains she has not seen dentist due to financial difficulties and has noted right upper molar tooth abscess but she felt it draining. [facility] asked to admit for further evaluation.   Principal Problem:   Facial cellulitis - secondary to likely ruptured abscess and progressively worse - ABX coverage extended to Vancomycin and Zosyn - spoke with dental team on call but oral surgery consult recommended - unfortunately no oral surgery on call this week - spoke with Dr. Jearld FentonByers [facility] on call, recommended transfer to tertiary medical center if no improvement on current ABX  - call Dr. Kristin BruinsKulinski office, awaiting call back Active Problems:   Periapical abscess with facial involvement - management as noted above - analgesia as needed    Tobacco abuse - cessation discussed in detail    Hypokalemia - mild, will continue to supplement via IV route as pt having pain with chewing and swallowing - BMP in AM   Consultants:  Trying to get oral surgery consultation, work in progress   Procedures/Studies: Ct Maxillofacial W/cm   12/15/2013  Cortical breakthrough of tooth #5 periapical abscess causing right facial soft tissue swelling/ cellulitis. No discrete soft tissue abscess.  Multiple dental caries and periapical abscesses as discussed above.    Antibiotics:  Vancomycin 01/12 -->  Zosyn 01/12 -->  Clindamycin 01/11 --> 01/12  Code Status: Full Family Communication:  Pt at bedside Disposition Plan: Home when medically stable  HPI/Subjective: No events overnight.   Objective: Filed Vitals:   12/16/13 0216 12/16/13 0555 12/16/13 1350 12/16/13 2155  BP: 111/61 103/56 117/71 114/75  Pulse: 72 75 101 81  Temp: 98 F (36.7 C) 98.1 F (36.7 C) 98.4 F (36.9 C) 98 F (36.7 C)  TempSrc: Oral Oral Oral Oral  Resp: 18 18    Height:      Weight:      SpO2: 100% 100% 100% 100%    Intake/Output Summary (Last 24 hours) at 12/16/13 2252 Last data filed at 12/16/13 2200  Gross per 24 hour  Intake   1190 ml  Output   1700 ml  Net   -510 ml    Exam:   General:  Pt is alert, follows commands appropriately, not in acute distress  HEENT: severe right facial cellulitis, TTP and warmth to touch   Cardiovascular: Regular rate and rhythm, S1/S2, no murmurs, no rubs, no gallops  Respiratory: Clear to auscultation bilaterally, no wheezing, no crackles, no rhonchi  Abdomen: Soft, non tender, non distended, bowel sounds present, no guarding  Extremities: No edema, pulses DP and PT palpable bilaterally  Neuro: Grossly nonfocal  Data Reviewed: Basic Metabolic Panel:  Recent Labs Lab 12/15/13 1750 12/16/13 0424  NA 136* 138  K 3.4* 3.4*  CL 98 100  CO2 22 27  GLUCOSE 92 94  BUN 7 7  CREATININE 0.72 0.79  CALCIUM 9.2 8.4   CBC:  Recent Labs Lab 12/15/13 1750 12/16/13 0424  WBC 13.2* 11.4*  HGB 11.8* 10.1*  HCT  34.9* 31.1*  MCV 78.3 80.2  PLT 307 293    Recent Results (from the past 240 hour(s))  MRSA PCR SCREENING     Status: None   Collection Time    12/16/13 11:04 AM      Result Value Range Status   MRSA by PCR NEGATIVE  NEGATIVE Final   Comment:            The GeneXpert MRSA Assay (FDA     approved for NASAL specimens     only), is one component of a     comprehensive MRSA colonization     surveillance program. It is not     intended to diagnose MRSA     infection nor to guide or     monitor treatment for     MRSA  infections.     Scheduled Meds: . enoxaparin (LOVENOX) injection  40 mg Subcutaneous Q24H  . piperacillin-tazobactam (ZOSYN)  IV  3.375 g Intravenous Q8H  . sodium chloride  3 mL Intravenous Q12H  . vancomycin  1,000 mg Intravenous Q8H   Continuous Infusions:    Debbora Presto, MD  [facility] Pager [phone]  If 7PM-7AM, please contact night-coverage [email or web address] Password [email or web address] 12/16/2013, 10:52 PM   LOS: 1 day

## 2013-12-16 NOTE — Progress Notes (Signed)
ANTIBIOTIC CONSULT NOTE - INITIAL  Pharmacy Consult for Vancomycin and Zosyn Indication: Periapical cellulitis, abscess  Allergies  Allergen Reactions  . Nickel Rash    Patient Measurements: Height: 5\' 8"  (172.7 cm) Weight: 141 lb 14.4 oz (64.365 kg) IBW/kg (Calculated) : 63.9  Vital Signs: Temp: 98.1 F (36.7 C) (01/12 0555) Temp src: Oral (01/12 0555) BP: 103/56 mmHg (01/12 0555) Pulse Rate: 75 (01/12 0555) Intake/Output from previous day: 01/11 0701 - 01/12 0700 In: 50 [IV Piggyback:50] Out: 700 [Urine:700] Intake/Output from this shift: Total I/O In: 360 [P.O.:360] Out: 500 [Urine:500]  Labs:  Recent Labs  12/15/13 1750 12/16/13 0424  WBC 13.2* 11.4*  HGB 11.8* 10.1*  PLT 307 293  CREATININE 0.72 0.79   Estimated Creatinine Clearance: 109.4 ml/min (by C-G formula based on Cr of 0.79). No results found for this basename: VANCOTROUGH, VANCOPEAK, VANCORANDOM, GENTTROUGH, GENTPEAK, GENTRANDOM, TOBRATROUGH, TOBRAPEAK, TOBRARND, AMIKACINPEAK, AMIKACINTROU, AMIKACIN,  in the last 72 hours   Microbiology: No results found for this or any previous visit (from the past 720 hour(s)).  Medical History: Past Medical History  Diagnosis Date  . No pertinent past medical history     Medications:  Scheduled:  . enoxaparin (LOVENOX) injection  40 mg Subcutaneous Q24H  . sodium chloride  3 mL Intravenous Q12H   Anti-infectives: 1/11 >> Rocephin >> 1/12 1/11 >> Clinda >> 1/12 1/12 >> Vanc >> 1/12 >> Zosyn >>  Assessment: 23 y.o. Female presents with severe facial swelling, pushing her right eye closed. CT maxillofacial noted multiple dental caries and periapical abscesses w/ cortical breakthrough on one. She stopped going to the dentist a year or so ago after she lost her insurance. Broadening antibiotic coverage from clindamycin to vancomycin and zosyn.  Goal of Therapy:  Vancomycin trough level 15-20 mcg/ml Eradication of infection  Plan:   Vancomycin 1g IV  q8h Check trough at steady state Zosyn 3.375gm IV q8h (4hr extended infusions) Follow up renal function & cultures (if ordered)  Loralee PacasErin Kramer Hanrahan, PharmD, BCPS Pager: [phone] 12/16/2013,11:13 AM

## 2013-12-17 ENCOUNTER — Inpatient Hospital Stay (HOSPITAL_COMMUNITY): Payer: Medicaid Other

## 2013-12-17 ENCOUNTER — Encounter (HOSPITAL_COMMUNITY): Payer: Self-pay | Admitting: Dentistry

## 2013-12-17 DIAGNOSIS — R221 Localized swelling, mass and lump, neck: Secondary | ICD-10-CM

## 2013-12-17 DIAGNOSIS — K029 Dental caries, unspecified: Secondary | ICD-10-CM

## 2013-12-17 DIAGNOSIS — K045 Chronic apical periodontitis: Secondary | ICD-10-CM

## 2013-12-17 DIAGNOSIS — R220 Localized swelling, mass and lump, head: Secondary | ICD-10-CM

## 2013-12-17 DIAGNOSIS — K0510 Chronic gingivitis, plaque induced: Secondary | ICD-10-CM

## 2013-12-17 LAB — VANCOMYCIN, TROUGH: VANCOMYCIN TR: 27.9 ug/mL — AB (ref 10.0–20.0)

## 2013-12-17 LAB — BASIC METABOLIC PANEL
BUN: 4 mg/dL — AB (ref 6–23)
CO2: 28 mEq/L (ref 19–32)
CREATININE: 0.89 mg/dL (ref 0.50–1.10)
Calcium: 8.7 mg/dL (ref 8.4–10.5)
Chloride: 100 mEq/L (ref 96–112)
GFR calc Af Amer: >90 mL/min (ref >90)
GFR, EST NON AFRICAN AMERICAN: 90 mL/min — AB (ref >90)
Glucose, Bld: 108 mg/dL — ABNORMAL HIGH (ref 70–99)
Potassium: 3.1 mEq/L — ABNORMAL LOW (ref 3.7–5.3)
Sodium: 140 mEq/L (ref 137–147)

## 2013-12-17 LAB — CBC
HEMATOCRIT: 31.5 % — AB (ref 36.0–46.0)
Hemoglobin: 10.6 g/dL — ABNORMAL LOW (ref 12.0–15.0)
MCH: 26.8 pg (ref 26.0–34.0)
MCHC: 33.7 g/dL (ref 30.0–36.0)
MCV: 79.7 fL (ref 78.0–100.0)
Platelets: 303 10*3/uL (ref 150–400)
RBC: 3.95 MIL/uL (ref 3.87–5.11)
RDW: 13.2 % (ref 11.5–15.5)
WBC: 10.8 10*3/uL — AB (ref 4.0–10.5)

## 2013-12-17 MED ORDER — ONDANSETRON HCL 4 MG/2ML IJ SOLN
4.0000 mg | INTRAMUSCULAR | Status: DC | PRN
  Administered 2013-12-18 – 2013-12-19 (×3): 4 mg via INTRAVENOUS
  Filled 2013-12-17 (×3): qty 2

## 2013-12-17 MED ORDER — POTASSIUM CHLORIDE CRYS ER 20 MEQ PO TBCR
40.0000 meq | EXTENDED_RELEASE_TABLET | Freq: Once | ORAL | Status: AC
  Administered 2013-12-17: 40 meq via ORAL
  Filled 2013-12-17: qty 2

## 2013-12-17 MED ORDER — ONDANSETRON HCL 4 MG PO TABS: 4.0000 mg | ORAL_TABLET | ORAL | Status: DC | PRN

## 2013-12-17 MED ORDER — VANCOMYCIN HCL IN DEXTROSE 1-5 GM/200ML-% IV SOLN
1000.0000 mg | Freq: Two times a day (BID) | INTRAVENOUS | Status: DC
  Administered 2013-12-18: 1000 mg via INTRAVENOUS
  Filled 2013-12-17 (×2): qty 200

## 2013-12-17 MED ORDER — PROMETHAZINE HCL 25 MG/ML IJ SOLN
25.0000 mg | Freq: Four times a day (QID) | INTRAMUSCULAR | Status: DC | PRN
  Administered 2013-12-17 – 2013-12-18 (×4): 25 mg via INTRAVENOUS
  Filled 2013-12-17 (×4): qty 1

## 2013-12-17 NOTE — Progress Notes (Signed)
ANTIBIOTIC CONSULT NOTE  Pharmacy Consult for Vancomycin and Zosyn Indication: Periapical cellulitis, abscess  Allergies  Allergen Reactions  . Nickel Rash    Patient Measurements: Height: 5\' 8"  (172.7 cm) Weight: 141 lb 14.4 oz (64.365 kg) IBW/kg (Calculated) : 63.9  Vital Signs: Temp: 99.2 F (37.3 C) (01/13 1400) Temp src: Oral (01/13 1400) BP: 114/69 mmHg (01/13 1400) Pulse Rate: 84 (01/13 1400) Intake/Output from previous day: 01/12 0701 - 01/13 0700 In: 1140 [P.O.:840; IV Piggyback:300] Out: 2000 [Urine:2000]  Labs:  Recent Labs  12/15/13 1750 12/16/13 0424 12/17/13 0534  WBC 13.2* 11.4* 10.8*  HGB 11.8* 10.1* 10.6*  PLT 307 293 303  CREATININE 0.72 0.79 0.89   Estimated Creatinine Clearance: 98.3 ml/min (by C-G formula based on Cr of 0.89).  Recent Labs  12/17/13 1920  VANCOTROUGH 27.9*   Anti-infectives: 1/11 >> Clinda >> 1/12 1/12 >> Vanc >> 1/12 >> Zosyn >>  Assessment: 23 y.o. Female presents with severe facial swelling, pushing her right eye closed. CT maxillofacial noted multiple dental caries and periapical abscesses w/ cortical breakthrough on one. She has not seen a dentist d/t loss of insurance.  Pharmacy is consulted to dose vancomycin and zosyn.  Day # 2 of vancomycin and Zosyn (s/p ~ 1 day clindamycin)  Tmax: 99.2  WBC: elevated, 10.8  Renal: SCr 0.89, CrCl ~ 98 ml/min CG  Vancomycin trough level: 27.9, above goal range.  8pm dose tonight is NOT given.   Goal of Therapy:  Vancomycin trough level 15-20 mcg/ml Eradication of infection  Plan:   Hold 8pm dose, then decrease to Vancomycin 1g Q12h Recheck vancomycin trough at steady state. Continue Zosyn 3.375gm IV q8h (4hr extended infusions) Follow up renal function & cultures as available.  Lynann Beaverhristine Othello Sgroi PharmD, BCPS Pager [phone] 12/17/2013 8:52 PM

## 2013-12-17 NOTE — Progress Notes (Signed)
Patient ID: Amanda Beasley, female   DOB: [birth year], 23 y.o.   MRN: [id number]  [facility] PROGRESS NOTE  Amanda Beasley:[id number] DOB: Nov 03, 1991 DOA: 12/15/2013 PCP: Default, Provider, MD  Brief narrative:  23 y.o. female who presented to [facility] ED with main concern of progressively worsening right facial swelling, erythema, constant throbbing pain, 10/10 in severity. Swelling is now extending to the eye area and even to the left side of the face. This has been associated with fevers, chills, poor oral intake. Pt explains she has not seen dentist due to financial difficulties and has noted right upper molar tooth abscess but she felt it draining. [facility] asked to admit for further evaluation.   Principal Problem:  Facial cellulitis  - secondary to likely ruptured abscess and progressively worse  - ABX coverage extended to Vancomycin and Zosyn and now on day #2 - pt is clinically improving, swelling is down with less tenderness to palpation  - continue supportive care with analgesia as needed Active Problems:  Periapical abscess with facial involvement  - management as noted above  - analgesia as needed  Tobacco abuse  - cessation discussed in detail  Hypokalemia  - mild, will continue to supplement via IV route as pt having pain with chewing and swallowing  - BMP in AM   Consultants:  Oral surgery consultation Procedures/Studies:  Ct Maxillofacial W/cm 12/15/2013 Cortical breakthrough of tooth #5 periapical abscess causing right facial soft tissue swelling/ cellulitis. No discrete soft tissue abscess. Multiple dental caries and periapical abscesses as discussed above.  Antibiotics:  Vancomycin 01/12 -->  Zosyn 01/12 -->  Clindamycin 01/11 --> 01/12  Code Status: Full  Family Communication: Pt at bedside  Disposition Plan: Home when medically stable  HPI/Subjective: No events overnight.   Objective: Filed Vitals:   12/16/13 0555 12/16/13 1350 12/16/13 2155  12/17/13 0541  BP: 103/56 117/71 114/75 118/74  Pulse: 75 101 81 80  Temp: 98.1 F (36.7 C) 98.4 F (36.9 C) 98 F (36.7 C) 99 F (37.2 C)  TempSrc: Oral Oral Oral Oral  Resp: 18   18  Height:      Weight:      SpO2: 100% 100% 100% 99%    Intake/Output Summary (Last 24 hours) at 12/17/13 1405 Last data filed at 12/17/13 1330  Gross per 24 hour  Intake    240 ml  Output   1225 ml  Net   -985 ml    Exam:   General:  Pt is alert, follows commands appropriately, not in acute distress  HEENT: facial cellulitis improving with less swelling and less TTP  Cardiovascular: Regular rate and rhythm, S1/S2, no murmurs, no rubs, no gallops  Respiratory: Clear to auscultation bilaterally, no wheezing, no crackles, no rhonchi  Abdomen: Soft, non tender, non distended, bowel sounds present, no guarding  Extremities: No edema, pulses DP and PT palpable bilaterally  Neuro: Grossly nonfocal  Data Reviewed: Basic Metabolic Panel:  Recent Labs Lab 12/15/13 1750 12/16/13 0424 12/17/13 0534  NA 136* 138 140  K 3.4* 3.4* 3.1*  CL 98 100 100  CO2 22 27 28   GLUCOSE 92 94 108*  BUN 7 7 4*  CREATININE 0.72 0.79 0.89  CALCIUM 9.2 8.4 8.7   CBC:  Recent Labs Lab 12/15/13 1750 12/16/13 0424 12/17/13 0534  WBC 13.2* 11.4* 10.8*  HGB 11.8* 10.1* 10.6*  HCT 34.9* 31.1* 31.5*  MCV 78.3 80.2 79.7  PLT 307 293 303   Recent Results (from  the past 240 hour(s))  MRSA PCR SCREENING     Status: None   Collection Time    12/16/13 11:04 AM      Result Value Range Status   MRSA by PCR NEGATIVE  NEGATIVE Final   Comment:            The GeneXpert MRSA Assay (FDA     approved for NASAL specimens     only), is one component of a     comprehensive MRSA colonization     surveillance program. It is not     intended to diagnose MRSA     infection nor to guide or     monitor treatment for     MRSA infections.     Scheduled Meds: . enoxaparin (LOVENOX) injection  40 mg Subcutaneous  Q24H  . piperacillin-tazobactam (ZOSYN)  IV  3.375 g Intravenous Q8H  . sodium chloride  3 mL Intravenous Q12H  . vancomycin  1,000 mg Intravenous Q8H   Continuous Infusions:   Amanda PrestoMAGICK-Aiana Nordquist, MD  [facility] Pager [phone]  If 7PM-7AM, please contact night-coverage [email or web address] Password [email or web address] 12/17/2013, 2:05 PM   LOS: 2 days

## 2013-12-17 NOTE — Progress Notes (Signed)
Patient was in shower. CNA in room. Patient became engrossed in pain and nausea to the point she felt faint. Knees buckled CNA was able to assist her to chair. Has had one prior episode of nausea. Was medicated for that. Will continue to monitor.

## 2013-12-17 NOTE — Consult Note (Signed)
DENTAL CONSULTATION  Date of Consultation:  12/17/2013 Patient Name:   Amanda Beasley Date of Birth:   May 18, 1991 Medical Record Number: [id number]  VITALS: BP 114/69  Pulse 84  Temp(Src) 99.2 F (37.3 C) (Oral)  Resp 18  Ht 5\' 8"  (1.727 m)  Wt 141 lb 14.4 oz (64.365 kg)  BMI 21.58 kg/m2  SpO2 100%  LMP 12/13/2013   CHIEF COMPLAINT: Dental consultation requested for evaluation of right facial swelling.  HPI: Amanda Beasley is a 23 year old female recently admitted with right facial swelling.  Patient was placed on IV antibiotics and a dental consultation was then requested to rule out dental infection and provide treatment is indicated. The patient is currently complaining of pain associated with the right facial swelling around her eye. Patient denies any specific mention of a toothache. Patient indicates that she started having facial swelling on Sunday, 12/15/2013. Patient went to the emergency room and was provided oral antibiotic therapy and pain medication. Patient indicates that the swelling worsened and she presented to the emergency room again and was admitted and placed on IV antibiotic therapy. Patient indicates that the swelling is decreased since she was placed on the IV antibiotics. Patient describes pain as being sharp and dull in nature. Patient indicates that it reaches an intensity of 8/10. Patient indicates that currently it is 5/10 due to the recent morphine pain medication administration.  Patient has not seen a dentist for" a long time". Patient indicates that this is approximately 4-5 years.  Patient indicates that she has no regular primary dentist. Patient does have Medicaid that covers her dental treatment.   PROBLEM LIST: Patient Active Problem List   Diagnosis Date Noted  . Tobacco abuse 12/16/2013  . Marijuana use 12/16/2013  . Hypokalemia 12/16/2013  . Periapical abscess with facial involvement 12/15/2013  . Facial cellulitis 12/15/2013  .  Late prenatal care complicating pregnancy 12/26/2012    PMH: Past Medical History  Diagnosis Date  . No pertinent past medical history     PSH: Past Surgical History  Procedure Laterality Date  . No past surgeries      ALLERGIES: Allergies  Allergen Reactions  . Nickel Rash    MEDICATIONS: Current Facility-Administered Medications  Medication Dose Route Frequency Provider Last Rate Last Dose  . 0.9 %  sodium chloride infusion  250 mL Intravenous PRN Hollice Espy, MD 20 mL/hr at 12/17/13 1256 500 mL at 12/17/13 1256  . acetaminophen (TYLENOL) tablet 650 mg  650 mg Oral Q6H PRN Hollice Espy, MD       Or  . acetaminophen (TYLENOL) suppository 650 mg  650 mg Rectal Q6H PRN Hollice Espy, MD      . enoxaparin (LOVENOX) injection 40 mg  40 mg Subcutaneous Q24H Hollice Espy, MD   40 mg at 12/16/13 2138  . LORazepam (ATIVAN) injection 1 mg  1 mg Intravenous BID PRN Dorothea Ogle, MD   1 mg at 12/17/13 0820  . morphine 2 MG/ML injection 1 mg  1 mg Intravenous Q4H PRN Hollice Espy, MD   1 mg at 12/17/13 1545  . ondansetron (ZOFRAN) tablet 4 mg  4 mg Oral Q4H PRN Dorothea Ogle, MD       Or  . ondansetron [facility]) injection 4 mg  4 mg Intravenous Q4H PRN Dorothea Ogle, MD      . oxyCODONE (Oxy IR/ROXICODONE) immediate release tablet 5 mg  5 mg Oral Q4H PRN Hollice Espy,  MD   5 mg at 12/17/13 1255  . piperacillin-tazobactam (ZOSYN) IVPB 3.375 g  3.375 g Intravenous Q8H Rollene Fare, RPH   3.375 g at 12/17/13 1144  . potassium chloride SA (K-DUR,KLOR-CON) CR tablet 40 mEq  40 mEq Oral Once Dorothea Ogle, MD      . promethazine (PHENERGAN) injection 25 mg  25 mg Intravenous Q6H PRN Dorothea Ogle, MD      . sodium chloride 0.9 % injection 3 mL  3 mL Intravenous Q12H Hollice Espy, MD   3 mL at 12/17/13 0820  . sodium chloride 0.9 % injection 3 mL  3 mL Intravenous PRN Hollice Espy, MD      . vancomycin (VANCOCIN) IVPB 1000 mg/200 mL premix  1,000 mg  Intravenous Q8H Rollene Fare, RPH   1,000 mg at 12/17/13 1144    LABS: Lab Results  Component Value Date   WBC 10.8* 12/17/2013   HGB 10.6* 12/17/2013   HCT 31.5* 12/17/2013   MCV 79.7 12/17/2013   PLT 303 12/17/2013      Component Value Date/Time   NA 140 12/17/2013 0534   K 3.1* 12/17/2013 0534   CL 100 12/17/2013 0534   CO2 28 12/17/2013 0534   GLUCOSE 108* 12/17/2013 0534   BUN 4* 12/17/2013 0534   CREATININE 0.89 12/17/2013 0534   CALCIUM 8.7 12/17/2013 0534   GFRNONAA 90* 12/17/2013 0534   GFRAA >90 12/17/2013 0534   No results found for this basename: INR, PROTIME   No results found for this basename: PTT    SOCIAL HISTORY: History   Social History  . Marital Status: Single    Spouse Name: N/A    Number of Children: N/A  . Years of Education: N/A   Occupational History  . Not on file.   Social History Main Topics  . Smoking status: Former Smoker    Quit date: 06/26/2012  . Smokeless tobacco: Never Used  . Alcohol Use: No  . Drug Use: No  . Sexual Activity: Not Currently    Birth Control/ Protection: None   Other Topics Concern  . Not on file   Social History Narrative  . No narrative on file    FAMILY HISTORY: Family History  Problem Relation Age of Onset  . Diabetes Mother   . Hypertension Mother   . Stroke Mother   . Heart disease Mother   . Diabetes Maternal Grandmother   . Cancer Father      REVIEW OF SYSTEMS: Reviewed from chart for this admission.  DENTAL HISTORY: CHIEF COMPLAINT: Dental consultation requested for evaluation of right facial swelling.  HPI: Amanda Beasley is a 23 year old female recently admitted with right facial swelling.  Patient was placed on IV antibiotics and a dental consultation was then requested to rule out dental infection and provide treatment is indicated. The patient is currently complaining of pain associated with the right facial swelling around her eye. Patient denies any specific mention of a  toothache. Patient indicates that she started having facial swelling on Sunday, 12/15/2013. Patient went to the emergency room and was provided oral antibiotic therapy and pain medication. Patient indicates that the swelling worsened and she presented to the emergency room again and was admitted and placed on IV antibiotic therapy. Patient indicates that the swelling is decreased since she was placed on the IV antibiotics. Patient describes pain as being sharp and dull in nature. Patient indicates that it reaches an intensity of 8/10.  Patient indicates that currently it is 5/10 due to the recent morphine pain medication administration.  Patient has not seen a dentist for" a long time". Patient indicates that this is approximately 4-5 years.  Patient indicates that she has no regular primary dentist. Patient does have Medicaid that covers her dental treatment.   DENTAL EXAMINATION:  GENERAL: The patient is a well-developed, well-nourished female in no acute distress. HEAD AND NECK:Patient does have right neck lymphadenopathy. Patient also has a right facial swelling with infraorbital swelling noted as well. Patient also appears to have some left infraorbital swelling.  INTRAORAL EXAM: There is minimal visualization of the oral cavity due to the significant trismus and decreased maximum interincisal opening of approximately 10-15 mm. Patient has normal saliva. There may be evidence of maxillary right buccal swelling although this is difficult to see due to the significant trismus. DENTITION:The patient is not missing any teeth. The patient appears to have multiple extensive dental caries associated with tooth numbers 2, 14, 15, 19, 30, and 32. Ideally need a full series of dental radiographs to identify the extent of the dental caries. PERIODONTAL: The patient has plaque and calculus accumulations and gingivitis. Patient may also have incipient periodontitis. DENTAL CARIES/SUBOPTIMAL RESTORATIONS: The  patient has multiple teeth with extensive dental caries. The patient needs a full series of dental radiographs to identify the extent of other dental caries.  ENDODONTIC:Patient currently is complaining of nonspecific dental pain. Patient has multiple areas of periapical pathology and radiolucency.  CROWN AND BRIDGE:Patient has no crown or bridge restorations.  PROSTHODONTIC:Patient has no partial dentures.  OCCLUSION:Patient has a poor occlusal scheme but a stable occlusion at this time.  RADIOGRAPHIC INTERPRETATION: An orthopantogram was ordered the department of radiology. The results are currently unavailable for review in the PACS system.   ASSESSMENTS: 1. Right facial swelling with dental etiology. 2. 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 Multiple teeth with extensive dental caries 4. Gingivitis and possible incipient periodontitis 5. Significant trismus with decreased maximum interincisal opening of approximately 10-15 mm 6. History of oral neglect    PLAN/RECOMMENDATIONS: 1. I discussed the risks, benefits, and complications of various treatment options with the patient in relationship to her medical and dental conditions. We discussed  the presence of a severe trismus with limited access to general anesthesia and extraction procedures. Currently the patient will need to be kept on IV antibiotic therapy until trismus symptoms and facial swelling resolve to the point where treatment can be provided. Ideally, the patient would benefit from evaluation and treatment by an oral surgeon due to the complexity of the case. The patient understands the plan of care at this time.  In the meantime, I will discuss the case with the medical attending and recommend additional infectious disease input if IV antibiotic therapy as prescribed currently does not seem to be improving the patient's condition. Additionally, the ear nose and throat specialist can be  reconsulted if it is determined that an incision and drainage procedure is required.   2. Discussion of findings with medical team and coordination of future medical and dental care as needed.   Charlynne Panderonald F. Breckan Cafiero, DDS

## 2013-12-17 NOTE — Progress Notes (Signed)
Thank you Dr. Kristin BruinsKulinski for you help and assistance!!!  Debbora PrestoMAGICK-Kimmie Doren, MD  [facility] Pager [phone] Cell [phone]   If 7PM-7AM, please contact night-coverage [email or web address] Password [email or web address]

## 2013-12-18 LAB — CBC
HCT: 31.3 % — ABNORMAL LOW (ref 36.0–46.0)
Hemoglobin: 10.3 g/dL — ABNORMAL LOW (ref 12.0–15.0)
MCH: 26.2 pg (ref 26.0–34.0)
MCHC: 32.9 g/dL (ref 30.0–36.0)
MCV: 79.6 fL (ref 78.0–100.0)
PLATELETS: 291 10*3/uL (ref 150–400)
RBC: 3.93 MIL/uL (ref 3.87–5.11)
RDW: 13 % (ref 11.5–15.5)
WBC: 10.1 10*3/uL (ref 4.0–10.5)

## 2013-12-18 LAB — BASIC METABOLIC PANEL
BUN: 7 mg/dL (ref 6–23)
BUN: 8 mg/dL (ref 6–23)
CALCIUM: 8.4 mg/dL (ref 8.4–10.5)
CHLORIDE: 98 meq/L (ref 96–112)
CO2: 24 mEq/L (ref 19–32)
CO2: 25 mEq/L (ref 19–32)
Calcium: 8.6 mg/dL (ref 8.4–10.5)
Chloride: 100 mEq/L (ref 96–112)
Creatinine, Ser: 2.65 mg/dL — ABNORMAL HIGH (ref 0.50–1.10)
Creatinine, Ser: 3.04 mg/dL — ABNORMAL HIGH (ref 0.50–1.10)
GFR calc non Af Amer: 20 mL/min — ABNORMAL LOW (ref >90)
GFR, EST AFRICAN AMERICAN: 29 mL/min — AB (ref >90)
GFR, EST AFRICAN AMERICAN: 24 mL/min — AB (ref >90)
GFR, EST NON AFRICAN AMERICAN: 25 mL/min — AB (ref >90)
Glucose, Bld: 98 mg/dL (ref 70–99)
Glucose, Bld: 90 mg/dL (ref 70–99)
Potassium: 3.5 mEq/L — ABNORMAL LOW (ref 3.7–5.3)
Potassium: 3.7 mEq/L (ref 3.7–5.3)
Sodium: 139 mEq/L (ref 137–147)
Sodium: 137 mEq/L (ref 137–147)

## 2013-12-18 LAB — CREATININE, SERUM
Creatinine, Ser: 3.36 mg/dL — ABNORMAL HIGH (ref 0.50–1.10)
GFR, EST AFRICAN AMERICAN: 21 mL/min — AB (ref >90)
GFR, EST NON AFRICAN AMERICAN: 18 mL/min — AB (ref >90)

## 2013-12-18 LAB — VANCOMYCIN, RANDOM: VANCOMYCIN RM: 16.7 ug/mL

## 2013-12-18 MED ORDER — VANCOMYCIN HCL IN DEXTROSE 750-5 MG/150ML-% IV SOLN
750.0000 mg | Freq: Once | INTRAVENOUS | Status: AC
  Administered 2013-12-18: 750 mg via INTRAVENOUS
  Filled 2013-12-18: qty 150

## 2013-12-18 MED ORDER — VANCOMYCIN HCL IN DEXTROSE 1-5 GM/200ML-% IV SOLN
1000.0000 mg | Freq: Once | INTRAVENOUS | Status: DC
Start: 2013-12-18 — End: 2013-12-18
  Filled 2013-12-18: qty 200

## 2013-12-18 MED ORDER — HYDROMORPHONE HCL PF 1 MG/ML IJ SOLN
1.0000 mg | INTRAMUSCULAR | Status: DC | PRN
  Administered 2013-12-18 – 2013-12-19 (×2): 1 mg via INTRAVENOUS
  Filled 2013-12-18 (×2): qty 1

## 2013-12-18 MED ORDER — POTASSIUM CHLORIDE 10 MEQ/100ML IV SOLN
10.0000 meq | INTRAVENOUS | Status: AC
  Administered 2013-12-18 (×2): 10 meq via INTRAVENOUS
  Filled 2013-12-18 (×3): qty 100

## 2013-12-18 MED ORDER — ENOXAPARIN SODIUM 30 MG/0.3ML ~~LOC~~ SOLN
30.0000 mg | Freq: Every day | SUBCUTANEOUS | Status: DC
  Filled 2013-12-18: qty 0.3

## 2013-12-18 NOTE — Progress Notes (Addendum)
[facility] PROGRESS NOTE  Amanda Beasley UEA:[id number] DOB: 1991/07/02 DOA: 12/15/2013 PCP: Default, Provider, MD  Brief narrative: 23 y.o. female who presented to [facility] ED 12/15/2013 with main concern of progressively worsening right facial swelling, erythema, constant throbbing pain, 10/10 in severity. Swelling is extending to the eye and the left side of the face. This has been associated with fevers, chills, poor oral intake. Pt explains she has not seen dentist due to financial difficulties and has noted right upper molar tooth abscess but she felt it draining. Pt was seen by a dentist and recommendation was that currently the patient needs to be kept on IV antibiotic therapy until trismus symptoms and facial swelling resolve to the point where treatment can be provided.   Assessment and Plan:  Principal Problem:  Facial cellulitis  - secondary to likely progressive ruptured abscess  - ABX coverage extended to Vancomycin and Zosyn and now on day #3  - will hold dose of Vancomycin today as the level is supra therapeutic - appreciate pharmacy monitoring  - pain management with morphine 1 mg every 4 hours IV PRN - spoke with Dr. Jearld Fenton over the phone, he agrees with IV ABX, no role for intervention at this time - continue to monitor clinical progress and we can re consult as indicated   Active Problems:  Acute renal failure - unclear etiology, will repeat BMP to ensure this is not a lab error - pt is still on IVF and will continue the same - will hold Vancomycin as well as level is supra therapeutic - if Cr is truly elevated, will consider changing ABX that is not renally cleared  Periapical abscess with facial involvement  - management as noted above  - analgesia as needed  Tobacco abuse  - cessation discussed in detail  Hypokalemia  - mild, will continue to supplement via IV route as pt having pain with chewing and swallowing  - replete today as well   Consultants:   Dr. Kristin Bruins  Dr. Jearld Fenton with ENT over the phone  Procedures/Studies:  Ct Maxillofacial W/cm 12/15/2013 Cortical breakthrough of tooth #5 periapical abscess causing right facial soft tissue swelling/ cellulitis. No discrete soft tissue abscess. Multiple dental caries and periapical abscesses as discussed above.  Antibiotics:  Vancomycin 01/12 --> Zosyn 01/12 -->  Clindamycin 01/11 --> 12/16/2013  Code Status: Full  Family Communication: family not at the bedside Disposition Plan: Home when medically stable   HPI/Subjective: No events overnight.   Objective: Filed Vitals:   12/17/13 1400 12/17/13 2144 12/18/13 0021 12/18/13 0611  BP: 114/69 108/70  111/69  Pulse: 84 90  79  Temp: 99.2 F (37.3 C) 95.8 F (35.4 C) 97.9 F (36.6 C) 98.2 F (36.8 C)  TempSrc: Oral Axillary  Axillary  Resp: 18 16  16   Height:      Weight:      SpO2: 100% 99%  97%    Intake/Output Summary (Last 24 hours) at 12/18/13 1030 Last data filed at 12/18/13 4782  Gross per 24 hour  Intake 2500.33 ml  Output   3725 ml  Net -1224.67 ml   Exam:  General: Pt is alert, follows commands appropriately, not in acute distress  HEENT: facial cellulitis improving with less swelling and less TTP  Cardiovascular: Regular rate and rhythm, S1/S2, no murmurs, no rubs, no gallops  Respiratory: Clear to auscultation bilaterally, no wheezing, no crackles, no rhonchi  Abdomen: Soft, non tender, non distended, bowel sounds present, no guarding  Extremities:  No edema, pulses DP and PT palpable bilaterally  Neuro: Grossly nonfocal  Data Reviewed: Basic Metabolic Panel:  Recent Labs Lab 12/15/13 1750 12/16/13 0424 12/17/13 0534 12/18/13 0533  NA 136* 138 140 139  K 3.4* 3.4* 3.1* 3.5*  CL 98 100 100 100  CO2 22 27 28 24   GLUCOSE 92 94 108* 98  BUN 7 7 4* 7  CREATININE 0.72 0.79 0.89 2.65*  CALCIUM 9.2 8.4 8.7 8.4   CBC:  Recent Labs Lab 12/15/13 1750 12/16/13 0424 12/17/13 0534 12/18/13 0533  WBC  13.2* 11.4* 10.8* 10.1  HGB 11.8* 10.1* 10.6* 10.3*  HCT 34.9* 31.1* 31.5* 31.3*  MCV 78.3 80.2 79.7 79.6  PLT 307 293 303 291    MRSA PCR SCREENING     Status: None   Collection Time    12/16/13 11:04 AM      Result Value Range Status   MRSA by PCR NEGATIVE  NEGATIVE Final     Scheduled Meds: . enoxaparin (LOVENOX) injection  40 mg Subcutaneous Q24H  . piperacillin-tazobactam (ZOSYN)  IV  3.375 g Intravenous Q8H  . sodium chloride  3 mL Intravenous Q12H    Amanda PrestoMAGICK-Kailyn Dubie, MD  [facility] Pager [phone]  If 7PM-7AM, please contact night-coverage [email or web address] Password [email or web address] 12/18/2013, 10:30 AM   LOS: 3 days

## 2013-12-18 NOTE — Progress Notes (Signed)
Pharmacy Consult for Vancomycin and Zosyn  Indication: Periapical cellulitis, abscess  See previous note from Lynann Beaverhristine Shade PharmD for full details.  Vanc trough elevated last night - dosage decreased from 1g IV q8h to 1g IV q12h   SCr acutely rising overnight 0.89 >> 2.65 CrCl decreased to 33 ml/min  Plan:  Hold further vancomycin doses  Check random level ~24hr after most recent dose - received 1g at midnight  Loralee PacasErin Doretta Remmert, PharmD, BCPS Pager: [phone] 12/18/2013 7:39 AM

## 2013-12-18 NOTE — Progress Notes (Signed)
Pharmacy Consult for Vancomycin and Zosyn  Indication: Periapical cellulitis, abscess  See previous note from Lynann Beaverhristine Shade PharmD for full details.  Vanc trough elevated last night - dosage decreased from 1g IV q8h to 1g IV q12h   SCr acutely rising overnight 0.89 >> 2.65 >>3.04>>3.36 CrCl decreased to 29 ml/min  Plan:  Redose with Vancomycin 750mg  IV x1 ~ 0000  Hold further vancomycin doses  Recheck random level when appropriate   Lorenza EvangelistGreen, Amanda Beasley 12/18/2013 11:42 PM

## 2013-12-19 DIAGNOSIS — N179 Acute kidney failure, unspecified: Secondary | ICD-10-CM | POA: Diagnosis present

## 2013-12-19 LAB — CBC
HCT: 31 % — ABNORMAL LOW (ref 36.0–46.0)
HEMOGLOBIN: 10 g/dL — AB (ref 12.0–15.0)
MCH: 25.6 pg — ABNORMAL LOW (ref 26.0–34.0)
MCHC: 32.3 g/dL (ref 30.0–36.0)
MCV: 79.3 fL (ref 78.0–100.0)
PLATELETS: 328 10*3/uL (ref 150–400)
RBC: 3.91 MIL/uL (ref 3.87–5.11)
RDW: 12.9 % (ref 11.5–15.5)
WBC: 9.2 10*3/uL (ref 4.0–10.5)

## 2013-12-19 LAB — BASIC METABOLIC PANEL
BUN: 11 mg/dL (ref 6–23)
CHLORIDE: 98 meq/L (ref 96–112)
CO2: 24 meq/L (ref 19–32)
Calcium: 8.6 mg/dL (ref 8.4–10.5)
Creatinine, Ser: 3.39 mg/dL — ABNORMAL HIGH (ref 0.50–1.10)
GFR calc Af Amer: 21 mL/min — ABNORMAL LOW (ref >90)
GFR calc non Af Amer: 18 mL/min — ABNORMAL LOW (ref >90)
Glucose, Bld: 90 mg/dL (ref 70–99)
POTASSIUM: 3.7 meq/L (ref 3.7–5.3)
SODIUM: 137 meq/L (ref 137–147)

## 2013-12-19 MED ORDER — SODIUM CHLORIDE 0.9 % IV SOLN: INTRAVENOUS | Status: DC

## 2013-12-19 MED ORDER — CLINDAMYCIN HCL 300 MG PO CAPS: 300.0000 mg | ORAL_CAPSULE | Freq: Three times a day (TID) | ORAL | Status: DC

## 2013-12-19 MED ORDER — ACETAMINOPHEN 500 MG PO TABS: 500.0000 mg | ORAL_TABLET | ORAL | Status: DC | PRN

## 2013-12-19 NOTE — Progress Notes (Signed)
Patient requested to take shower,IV saline locked so patient could shower. RN back in to hook up IV patient refused stated she wanted to speak to her DR first. RN explained to patient she needed her IV hooked up so she could receive her IV abx. Patient stated she still wanted to wait until after seeing her DR. Will continue to monitor patient. C.Zaria Taha,RN

## 2013-12-19 NOTE — Discharge Summary (Signed)
Physician Discharge Summary  Amanda Beasley ZOX:[id number] DOB: 1991/09/27 DOA: 12/15/2013  PCP: Default, Provider, MD  Admit date: 12/15/2013 Discharge date: 12/19/2013  LEFT Against Medical advice  Recommendations for Outpatient Follow-up:  1. Fu with dentist in 2-3days 2. Lab-[facility] in 1 day 3. PCP in 1 week  Discharge Diagnoses:  Principal Problem:   Facial cellulitis Active Problems:   Periapical abscess with facial involvement   Tobacco abuse   Marijuana use   Hypokalemia   Acute renal failure   Left AMA  Filed Weights   12/15/13 1929  Weight: 64.365 kg (141 lb 14.4 oz)    History of present illness:  Amanda Beasley is a 23 y.o. female  Past history tobacco abuse who has stopped going to the dentist a year or so ago after she lost her insurance. Patient noted in the last 2 days she started have some mild facial swelling and tooth pain. Patient to come to the emergency room in the early morning hours of 1/11 and sent home with by mouth antibiotics and medication for pain, however she woke up this morning with severe facial swelling, pushing her right eye closed and she felt so sick, she is unable to tolerate by mouth antibiotics. She came back into the emergency room and was noted to have a mild leukocytosis. CT maxillofacial was done which noted multiple dental caries and periapical abscess ease on a number of teeth, with notable cortical breakthrough on one tooth felt to be the cause of right facial soft tissue swelling and cellulitis without any post septal or intraconal or orbital involvement. Patient was given dose of IV clindamycin and hospitalists were called for further evaluation and admission.   Hospital Course:  She clinically improved with IV Vancomycin in terms of her facial cellulitis, she was seen by Dr.Kulinski with dentistry and was to get definitive management of her periapical abscesses. But yesterday developed ARF due to Vanc toxicity, her Vanc  level was elevated at 27 and hence dose decreased per pharmacy. Today when i went to see her for the first time and she was anxious and adamant to be discharged despite her renal failure, it explained to her multiple times that it was not safe to be discharged till her kidney function improves, but she was adamant to leave despite my warnings of possibility of deteriorating kidney function and electrolyte abnormality. I gave her a prescription for PO clindamycin, advised her to drink plenty of fluids, avoid NSAIDs, and have labs drawn tomorrow at Urgent care. She signed AMA form and left   Consultations:  Dentistry Dr.Kulinski  Discharge Exam: Filed Vitals:   12/19/13 0613  BP: 111/69  Pulse: 66  Temp: 98.2 F (36.8 C)  Resp: 17    General: AAOx3, swelling of R infraorbital and maxillary space HEENT: improved mouth opening Cardiovascular: S1S2/RRR Respiratory: CTAB  Discharge Instructions     Medication List    STOP taking these medications       amoxicillin-clavulanate 875-125 MG per tablet  Commonly known as:  AUGMENTIN     ibuprofen 200 MG tablet  Commonly known as:  ADVIL,MOTRIN     naproxen 500 MG tablet  Commonly known as:  NAPROSYN     oxyCODONE-acetaminophen 5-325 MG per tablet  Commonly known as:  PERCOCET      TAKE these medications       acetaminophen 500 MG tablet  Commonly known as:  TYLENOL  Take 1 tablet (500 mg total) by mouth every 4 (  four) hours as needed for moderate pain or headache.     clindamycin 300 MG capsule  Commonly known as:  CLEOCIN  Take 1 capsule (300 mg total) by mouth 3 (three) times daily. For 7days       Allergies  Allergen Reactions  . Nickel Rash       Follow-up Information   Follow up with PCP . Schedule an appointment as soon as possible for a visit in 3 days.      Follow up with [facility] On 12/20/2013.      Follow up with Dentist. Schedule an appointment as soon as possible for a visit in 1 week.        The results of significant diagnostics from this hospitalization (including imaging, microbiology, ancillary and laboratory) are listed below for reference.    Significant Diagnostic Studies: Dg Orthopantogram  12/17/2013   CLINICAL DATA:  Right facial swelling, query periapical pathology.  EXAM: ORTHOPANTOGRAM/PANORAMIC  COMPARISON:  CT MAXILLOFACIAL W/CM dated 12/15/2013  FINDINGS: Extensive tooth decay in noted. Suspected large cavities in teeth 14, 15, 19, 30, 32, and 2. Periapical lucencies are associated with all of these cavities except for the cavity in tooth number 19.  IMPRESSION: 1. Extensive tooth decay with cavities and periapical lucencies detailed above. Similar appearance to facial CT from 12/15/2013.   Electronically Signed   By: Herbie BaltimoreWalt  Liebkemann M.D.   On: 12/17/2013 17:01   Ct Maxillofacial W/cm  12/15/2013   CLINICAL DATA:  [age] year old female with right maxillary and facial pain and swelling.  EXAM: CT MAXILLOFACIAL WITH CONTRAST  TECHNIQUE: Multidetector CT imaging of the maxillofacial structures was performed with intravenous contrast. Multiplanar CT image reconstructions were also generated. A small metallic BB was placed on the right temple in order to reliably differentiate right from left.  CONTRAST:  100mL OMNIPAQUE IOHEXOL 300 MG/ML  SOLN  COMPARISON:  None.  FINDINGS: There are multiple dental caries and periapical abscesses, which include teeth #2-6, 14, 15, 19, 30 and 32. There is moderate right facial soft tissue swelling without discrete soft tissue abscess. The periapical abscess of tooth #5 appears to have cortical breakthrough and likely the cause of right facial soft tissue swelling/ cellulitis. There is no evidence of fracture, subluxation or dislocation.  No other focal bony abnormalities are noted.  Mild mucosal thickening within the right maxillary sinus is noted.  Orbits and globes are unremarkable. There is no evidence of postseptal or intraconal abnormality.   The mastoid air cells and inner/ middle ears are clear.  IMPRESSION: Cortical breakthrough of tooth #5 periapical abscess causing right facial soft tissue swelling/ cellulitis. No discrete soft tissue abscess.  Multiple dental caries and periapical abscesses as discussed above.   Electronically Signed   By: Laveda AbbeJeff  Hu M.D.   On: 12/15/2013 07:46    Microbiology: Recent Results (from the past 240 hour(s))  MRSA PCR SCREENING     Status: None   Collection Time    12/16/13 11:04 AM      Result Value Range Status   MRSA by PCR NEGATIVE  NEGATIVE Final   Comment:            The GeneXpert MRSA Assay (FDA     approved for NASAL specimens     only), is one component of a     comprehensive MRSA colonization     surveillance program. It is not     intended to diagnose MRSA     infection nor to guide or  monitor treatment for     MRSA infections.     Labs: Basic Metabolic Panel:  Recent Labs Lab 12/16/13 0424 12/17/13 0534 12/18/13 0533 12/18/13 1515 12/18/13 2200 12/19/13 0545  NA 138 140 139 137  --  137  K 3.4* 3.1* 3.5* 3.7  --  3.7  CL 100 100 100 98  --  98  CO2 27 28 24 25   --  24  GLUCOSE 94 108* 98 90  --  90  BUN 7 4* 7 8  --  11  CREATININE 0.79 0.89 2.65* 3.04* 3.36* 3.39*  CALCIUM 8.4 8.7 8.4 8.6  --  8.6   Liver Function Tests: No results found for this basename: AST, ALT, ALKPHOS, BILITOT, PROT, ALBUMIN,  in the last 168 hours No results found for this basename: LIPASE, AMYLASE,  in the last 168 hours No results found for this basename: AMMONIA,  in the last 168 hours CBC:  Recent Labs Lab 12/15/13 1750 12/16/13 0424 12/17/13 0534 12/18/13 0533 12/19/13 0545  WBC 13.2* 11.4* 10.8* 10.1 9.2  HGB 11.8* 10.1* 10.6* 10.3* 10.0*  HCT 34.9* 31.1* 31.5* 31.3* 31.0*  MCV 78.3 80.2 79.7 79.6 79.3  PLT 307 293 303 291 328   Cardiac Enzymes: No results found for this basename: CKTOTAL, CKMB, CKMBINDEX, TROPONINI,  in the last 168 hours BNP: BNP (last 3  results) No results found for this basename: PROBNP,  in the last 8760 hours CBG: No results found for this basename: GLUCAP,  in the last 168 hours     Signed:  Yvetta Drotar  [facility] 12/19/2013, 8:30 PM

## 2013-12-19 NOTE — Progress Notes (Signed)
Patient signed AMA for and left AMA. Dr Jomarie LongsJoseph aware. C Faryn Sieg,RN

## 2013-12-24 ENCOUNTER — Inpatient Hospital Stay (HOSPITAL_COMMUNITY): Payer: Medicaid Other

## 2013-12-24 ENCOUNTER — Encounter (HOSPITAL_COMMUNITY): Payer: Self-pay | Admitting: Emergency Medicine

## 2013-12-24 ENCOUNTER — Inpatient Hospital Stay (HOSPITAL_COMMUNITY)
Admission: EM | Admit: 2013-12-24 | Discharge: 2013-12-28 | DRG: 683 | Disposition: A | Discharge status: Home / Self Care | Payer: Medicaid Other | Attending: Internal Medicine | Admitting: Internal Medicine

## 2013-12-24 DIAGNOSIS — K047 Periapical abscess without sinus: Secondary | ICD-10-CM | POA: Diagnosis present

## 2013-12-24 DIAGNOSIS — N390 Urinary tract infection, site not specified: Secondary | ICD-10-CM | POA: Diagnosis present

## 2013-12-24 DIAGNOSIS — F1290 Cannabis use, unspecified, uncomplicated: Secondary | ICD-10-CM

## 2013-12-24 DIAGNOSIS — T368X5A Adverse effect of other systemic antibiotics, initial encounter: Secondary | ICD-10-CM | POA: Diagnosis present

## 2013-12-24 DIAGNOSIS — F1210 Cannabis abuse, uncomplicated: Secondary | ICD-10-CM | POA: Diagnosis present

## 2013-12-24 DIAGNOSIS — K029 Dental caries, unspecified: Secondary | ICD-10-CM | POA: Diagnosis present

## 2013-12-24 DIAGNOSIS — R112 Nausea with vomiting, unspecified: Secondary | ICD-10-CM

## 2013-12-24 DIAGNOSIS — N179 Acute kidney failure, unspecified: Principal | ICD-10-CM

## 2013-12-24 DIAGNOSIS — N170 Acute kidney failure with tubular necrosis: Secondary | ICD-10-CM

## 2013-12-24 DIAGNOSIS — L03211 Cellulitis of face: Secondary | ICD-10-CM

## 2013-12-24 DIAGNOSIS — R109 Unspecified abdominal pain: Secondary | ICD-10-CM | POA: Diagnosis present

## 2013-12-24 DIAGNOSIS — A088 Other specified intestinal infections: Secondary | ICD-10-CM | POA: Diagnosis not present

## 2013-12-24 DIAGNOSIS — E876 Hypokalemia: Secondary | ICD-10-CM | POA: Diagnosis present

## 2013-12-24 DIAGNOSIS — Z720 Tobacco use: Secondary | ICD-10-CM

## 2013-12-24 DIAGNOSIS — L0201 Cutaneous abscess of face: Secondary | ICD-10-CM

## 2013-12-24 DIAGNOSIS — Z87891 Personal history of nicotine dependence: Secondary | ICD-10-CM

## 2013-12-24 DIAGNOSIS — N141 Nephropathy induced by other drugs, medicaments and biological substances: Secondary | ICD-10-CM

## 2013-12-24 DIAGNOSIS — N189 Chronic kidney disease, unspecified: Secondary | ICD-10-CM

## 2013-12-24 DIAGNOSIS — O0930 Supervision of pregnancy with insufficient antenatal care, unspecified trimester: Secondary | ICD-10-CM

## 2013-12-24 DIAGNOSIS — N1419 Nephropathy induced by other drugs, medicaments and biological substances: Secondary | ICD-10-CM | POA: Diagnosis present

## 2013-12-24 DIAGNOSIS — M549 Dorsalgia, unspecified: Secondary | ICD-10-CM | POA: Diagnosis present

## 2013-12-24 HISTORY — DX: Chronic kidney disease, unspecified: N18.9

## 2013-12-24 HISTORY — DX: Cutaneous abscess, unspecified: L02.91

## 2013-12-24 LAB — COMPREHENSIVE METABOLIC PANEL
ALBUMIN: 3.1 g/dL — AB (ref 3.5–5.2)
ALT: 6 U/L (ref 0–35)
AST: 11 U/L (ref 0–37)
Alkaline Phosphatase: 65 U/L (ref 39–117)
BUN: 14 mg/dL (ref 6–23)
CO2: 23 meq/L (ref 19–32)
CREATININE: 2.75 mg/dL — AB (ref 0.50–1.10)
Calcium: 9.2 mg/dL (ref 8.4–10.5)
Chloride: 98 mEq/L (ref 96–112)
GFR calc Af Amer: 27 mL/min — ABNORMAL LOW (ref >90)
GFR, EST NON AFRICAN AMERICAN: 23 mL/min — AB (ref >90)
Glucose, Bld: 103 mg/dL — ABNORMAL HIGH (ref 70–99)
Potassium: 3.4 mEq/L — ABNORMAL LOW (ref 3.7–5.3)
Sodium: 138 mEq/L (ref 137–147)
Total Bilirubin: 0.3 mg/dL (ref 0.3–1.2)
Total Protein: 7.9 g/dL (ref 6.0–8.3)

## 2013-12-24 LAB — URINALYSIS, ROUTINE W REFLEX MICROSCOPIC
Bilirubin Urine: NEGATIVE
Glucose, UA: NEGATIVE mg/dL
Ketones, ur: NEGATIVE mg/dL
NITRITE: NEGATIVE
Protein, ur: NEGATIVE mg/dL
Specific Gravity, Urine: 1.005 (ref 1.005–1.030)
UROBILINOGEN UA: 0.2 mg/dL (ref 0.0–1.0)
pH: 6 (ref 5.0–8.0)

## 2013-12-24 LAB — CBC WITH DIFFERENTIAL/PLATELET
BASOS PCT: 0 % (ref 0–1)
Basophils Absolute: 0 10*3/uL (ref 0.0–0.1)
Eosinophils Absolute: 0.3 10*3/uL (ref 0.0–0.7)
Eosinophils Relative: 3 % (ref 0–5)
HEMATOCRIT: 31.3 % — AB (ref 36.0–46.0)
HEMOGLOBIN: 10.7 g/dL — AB (ref 12.0–15.0)
Lymphocytes Relative: 17 % (ref 12–46)
Lymphs Abs: 2.1 10*3/uL (ref 0.7–4.0)
MCH: 26.6 pg (ref 26.0–34.0)
MCHC: 34.2 g/dL (ref 30.0–36.0)
MCV: 77.9 fL — AB (ref 78.0–100.0)
MONO ABS: 1.7 10*3/uL — AB (ref 0.1–1.0)
MONOS PCT: 13 % — AB (ref 3–12)
Neutro Abs: 8.5 10*3/uL — ABNORMAL HIGH (ref 1.7–7.7)
Neutrophils Relative %: 68 % (ref 43–77)
Platelets: 407 10*3/uL — ABNORMAL HIGH (ref 150–400)
RBC: 4.02 MIL/uL (ref 3.87–5.11)
RDW: 12.8 % (ref 11.5–15.5)
WBC: 12.7 10*3/uL — ABNORMAL HIGH (ref 4.0–10.5)

## 2013-12-24 LAB — URINE MICROSCOPIC-ADD ON

## 2013-12-24 LAB — PREGNANCY, URINE: Preg Test, Ur: NEGATIVE

## 2013-12-24 LAB — LIPASE, BLOOD: LIPASE: 28 U/L (ref 11–59)

## 2013-12-24 MED ORDER — ENSURE COMPLETE PO LIQD: 237.0000 mL | ORAL | Status: DC

## 2013-12-24 MED ORDER — CLINDAMYCIN PHOSPHATE 300 MG/50ML IV SOLN
300.0000 mg | Freq: Three times a day (TID) | INTRAVENOUS | Status: DC
  Administered 2013-12-24 – 2013-12-28 (×12): 300 mg via INTRAVENOUS
  Filled 2013-12-24 (×14): qty 50

## 2013-12-24 MED ORDER — SODIUM CHLORIDE 0.9 % IV BOLUS (SEPSIS)
1000.0000 mL | Freq: Once | INTRAVENOUS | Status: AC
  Administered 2013-12-24: 1000 mL via INTRAVENOUS

## 2013-12-24 MED ORDER — OXYCODONE HCL 5 MG PO TABS
5.0000 mg | ORAL_TABLET | ORAL | Status: DC | PRN
  Administered 2013-12-24: 5 mg via ORAL
  Filled 2013-12-24: qty 1

## 2013-12-24 MED ORDER — PROMETHAZINE HCL 25 MG/ML IJ SOLN
12.5000 mg | Freq: Four times a day (QID) | INTRAMUSCULAR | Status: DC | PRN
  Administered 2013-12-24 – 2013-12-25 (×2): 25 mg via INTRAVENOUS
  Administered 2013-12-25: 12.5 mg via INTRAVENOUS
  Administered 2013-12-25 – 2013-12-26 (×2): 25 mg via INTRAVENOUS
  Filled 2013-12-24 (×8): qty 1

## 2013-12-24 MED ORDER — DEXTROSE 5 % IV SOLN
1.0000 g | INTRAVENOUS | Status: DC
  Administered 2013-12-24 – 2013-12-28 (×5): 1 g via INTRAVENOUS
  Filled 2013-12-24 (×5): qty 10

## 2013-12-24 MED ORDER — ACETAMINOPHEN 500 MG PO TABS
500.0000 mg | ORAL_TABLET | ORAL | Status: DC | PRN
  Administered 2013-12-24 (×3): 500 mg via ORAL
  Filled 2013-12-24 (×4): qty 1

## 2013-12-24 MED ORDER — CLINDAMYCIN HCL 300 MG PO CAPS
300.0000 mg | ORAL_CAPSULE | Freq: Three times a day (TID) | ORAL | Status: DC
  Filled 2013-12-24 (×3): qty 1

## 2013-12-24 MED ORDER — ONDANSETRON HCL 4 MG/2ML IJ SOLN
4.0000 mg | Freq: Four times a day (QID) | INTRAMUSCULAR | Status: DC | PRN
  Administered 2013-12-24 (×2): 4 mg via INTRAVENOUS
  Filled 2013-12-24 (×2): qty 2

## 2013-12-24 MED ORDER — HEPARIN SODIUM (PORCINE) 5000 UNIT/ML IJ SOLN
5000.0000 [IU] | Freq: Three times a day (TID) | INTRAMUSCULAR | Status: DC
  Administered 2013-12-24 – 2013-12-28 (×9): 5000 [IU] via SUBCUTANEOUS
  Filled 2013-12-24 (×16): qty 1

## 2013-12-24 MED ORDER — BOOST / RESOURCE BREEZE PO LIQD
1.0000 | Freq: Two times a day (BID) | ORAL | Status: DC
  Administered 2013-12-24: 1 via ORAL

## 2013-12-24 MED ORDER — SODIUM CHLORIDE 0.9 % IV SOLN
INTRAVENOUS | Status: DC
  Administered 2013-12-24 – 2013-12-25 (×3): via INTRAVENOUS
  Administered 2013-12-26: 125 mL/h via INTRAVENOUS
  Administered 2013-12-27: 06:00:00 via INTRAVENOUS

## 2013-12-24 NOTE — Progress Notes (Signed)
Pt admitted to unit, room 5w09 by wheelchair. Pt alert & oriented. IV intact. Skin intact, pt present w/ multiple tattoos on arms bilaterally. Pt oriented to room, call bell within reach. Will monitor pt per MD orders.

## 2013-12-24 NOTE — Progress Notes (Signed)
Nutrition Brief Note  RD saw pt earlier today and ordered Ensure Complete, per pt request. Pt seen by MD since RD eval and pt now downgraded to Clear Liquids 2/2 nausea.   RN paged RD regarding current supplement order. Will discontinue current order of Ensure Complete and reassess need for supplements at next visit.  RD to continue to follow.  Amanda MottoSamantha Chasyn Cinque MS, RD, LDN Pager: [phone] After-hours pager: [phone]

## 2013-12-24 NOTE — ED Notes (Signed)
Patient is tearful and upset about situation. Emotional support given to patient.

## 2013-12-24 NOTE — Progress Notes (Signed)
Patient admitted this AM. H&P reviewed.  Patient has nausea today. Denies abdominal pain but has back pain.  VSS Lungs are CTA S1S2 is normal regular. No s3s4. No rubs murmurs or bruits. Abdomen is soft. NT. BS present. No CVA tenderness.  Will add Zofran. Clear liquids. Change Abx to IV for now. Add Ceftriaxone and urine culture. Renal US report reviewed. Denies other health problems. Has appointment with dentist this Friday. No other reason for back pain appreciated. No neuro deficits. She is walking. Will check Urine pregnancy as one has not been checked recently.  Continue current management with changes as noted above.  Jackelynn Hosie 12/24/2013 10:29 AM

## 2013-12-24 NOTE — Progress Notes (Signed)
Pt continues to be nauseous and states Zofran causes more nausea during pregnancy.  MD notified.  Will continue to assess.

## 2013-12-24 NOTE — Progress Notes (Signed)
UR completed. Johnte Portnoy RN CCM Case Mgmt 

## 2013-12-24 NOTE — H&P (Signed)
Amanda Beasley History and Physical  Amanda Beasley:[id number] DOB: Jul 21, 1991 DOA: 12/24/2013  Referring physician: EDP PCP: Default, Provider, MD   Chief Complaint: Back pain   HPI: Amanda Beasley is a 23 y.o. female who just left AMA from our service at [facility] hospital on 1/15.  She had been being treated for facial cellulitis and periapical abscess (which has gotten better); however, the patients stay had been complicated by AKI that she developed suddenly on the 14th associated with an elevated vanc trough of 27.  It was believed that the patient is suffering from vancomycin nephrotoxicity, and plans made to hydrate, stop vanc, and follow renal function; however, the patient left AMA at that time.  She presents with interval development of bilateral flank pain, radiating down into her abdomen and describes CVA tenderness as well.  Work up in the ED shows slight improvement in kidney function with a creatinine of 2.45 down from 3.39 at [facility] discharge.  Review of Systems: Systems reviewed.  As above, otherwise negative  Past Medical History  Diagnosis Date  . No pertinent past medical history    Past Surgical History  Procedure Laterality Date  . No past surgeries     Social History:  reports that she quit smoking about 17 months ago. She has never used smokeless tobacco. She reports that she does not drink alcohol or use illicit drugs.  Allergies  Allergen Reactions  . Nickel Rash    Family History  Problem Relation Age of Onset  . Diabetes Mother   . Hypertension Mother   . Stroke Mother   . Heart disease Mother   . Diabetes Maternal Grandmother   . Cancer Father      Prior to Admission medications   Medication Sig Start Date End Date Taking? Authorizing Provider  acetaminophen (TYLENOL) 500 MG tablet Take 1 tablet (500 mg total) by mouth every 4 (four) hours as needed for moderate pain or headache. 12/19/13  Yes Zannie Cove, MD  clindamycin (CLEOCIN)  300 MG capsule Take 1 capsule (300 mg total) by mouth 3 (three) times daily. For 7days 12/19/13  Yes Zannie Cove, MD   Physical Exam: Filed Vitals:   12/24/13 0445  BP: 128/65  Pulse: 83  Temp:   Resp:     BP 128/65  Pulse 83  Temp(Src) 99.4 F (37.4 C) (Oral)  Resp 20  SpO2 100%  LMP 12/23/2013  General Appearance:    Alert, oriented, no distress, appears stated age  Head:    Normocephalic, atraumatic  Eyes:    PERRL, EOMI, sclera non-icteric        Nose:   Nares without drainage or epistaxis. Mucosa, turbinates normal  Throat:   Moist mucous membranes. Oropharynx without erythema or exudate.  Neck:   Supple. No carotid bruits.  No thyromegaly.  No lymphadenopathy.   Back:     No CVA tenderness, no spinal tenderness  Lungs:     Clear to auscultation bilaterally, without wheezes, rhonchi or rales  Chest wall:    No tenderness to palpitation  Heart:    Regular rate and rhythm without murmurs, gallops, rubs  Abdomen:     Soft, non-tender, nondistended, normal bowel sounds, no organomegaly  Genitalia:    deferred  Rectal:    deferred  Extremities:   No clubbing, cyanosis or edema.  Pulses:   2+ and symmetric all extremities  Skin:   Skin color, texture, turgor normal, no rashes or lesions  Lymph nodes:  Cervical, supraclavicular, and axillary nodes normal  Neurologic:   CNII-XII intact. Normal strength, sensation and reflexes      throughout    Labs on Admission:  Basic Metabolic Panel:  Recent Labs Lab 12/18/13 0533 12/18/13 1515 12/18/13 2200 12/19/13 0545 12/24/13 0320  NA 139 137  --  137 138  K 3.5* 3.7  --  3.7 3.4*  CL 100 98  --  98 98  CO2 24 25  --  24 23  GLUCOSE 98 90  --  90 103*  BUN 7 8  --  11 14  CREATININE 2.65* 3.04* 3.36* 3.39* 2.75*  CALCIUM 8.4 8.6  --  8.6 9.2   Liver Function Tests:  Recent Labs Lab 12/24/13 0320  AST 11  ALT 6  ALKPHOS 65  BILITOT 0.3  PROT 7.9  ALBUMIN 3.1*    Recent Labs Lab 12/24/13 0320  LIPASE  28   No results found for this basename: AMMONIA,  in the last 168 hours CBC:  Recent Labs Lab 12/18/13 0533 12/19/13 0545 12/24/13 0320  WBC 10.1 9.2 12.7*  NEUTROABS  --   --  8.5*  HGB 10.3* 10.0* 10.7*  HCT 31.3* 31.0* 31.3*  MCV 79.6 79.3 77.9*  PLT 291 328 407*   Cardiac Enzymes: No results found for this basename: CKTOTAL, CKMB, CKMBINDEX, TROPONINI,  in the last 168 hours  BNP (last 3 results) No results found for this basename: PROBNP,  in the last 8760 hours CBG: No results found for this basename: GLUCAP,  in the last 168 hours  Radiological Exams on Admission: No results found.  EKG: Independently reviewed.  Assessment/Plan Principal Problem:   Acute renal failure Active Problems:   Periapical abscess with facial involvement   Vancomycin-induced nephrotoxicity   1. ARF - acute renal failure, most likely due to vancomycin-induced nephrotoxicity, B renal ultrasound ordered, consider nephrology consultation, monitor intake and output, IVF 125 cc/hr. 2. Periapical abscess - improved on clindamycin, continue clinda, has apointment on Friday for definitive management with dentist.    Code Status: Full Code  Family Communication: No family in room Disposition Plan: Admit to inpatient   Time spent: 50 min  Beasley, Amanda M. Amanda Beasley Pager [phone]  If 7AM-7PM, please contact the day team taking care of the patient [email or web address] Password [email or web address] 12/24/2013, 5:34 AM

## 2013-12-24 NOTE — Progress Notes (Signed)
INITIAL NUTRITION ASSESSMENT  DOCUMENTATION CODES Per approved criteria  -Not Applicable   INTERVENTION: Please obtain new weight on patient. Add Ensure Complete po daily, each supplement provides 350 kcal and 13 grams of protein. RD to continue to follow nutrition care plan.  NUTRITION DIAGNOSIS: Inadequate oral intake related to poor appetite as evidenced by pt report.   Goal: Intake to meet >90% of estimated nutrition needs.  Monitor:  weight trends, lab trends, I/O's, PO intake, supplement tolerance  Reason for Assessment: Health History  23 y.o. female  Admitting Dx: Acute renal failure  ASSESSMENT: No significant PMHx. [facility] for facial cellulitis and periapical abscess. Pt's stay was complicated by AKI that was believed to be from vancomycin nephrotoxicity. Admitted now with bilateral flank pain. Work-up reveals ARF.  Pt is currently ordered for a Regular diet. She reports that she is eating very little. States that she was on a liquid diet at home after she left AMA. Pt unable to provide much hx.  Pt states that she does not weigh 111 lb. States that she is more likely around 140 lb, which is consistent with her previous admission weight. RD attempted to re-weigh pt however the bed scale says 180 lb, which is not correct.  Pt is at nutrition risk 2/2 poor appetite and acute medical issues. She states that she would like to try Ensure. RD to order.  Potassium is currently low at 3.4.  Height: Ht Readings from Last 1 Encounters:  12/24/13 5\' 8"  (1.727 m)    Weight: Wt Readings from Last 1 Encounters:  12/24/13 111 lb 5.3 oz (50.5 kg)  140 lb (per pt)  Ideal Body Weight: 140 lb  % Ideal Body Weight: 100%  Wt Readings from Last 10 Encounters:  12/24/13 111 lb 5.3 oz (50.5 kg)  12/15/13 141 lb 14.4 oz (64.365 kg)  12/29/12 178 lb (80.74 kg)  12/26/12 175 lb 4.8 oz (79.516 kg)  11/26/12 152 lb (68.947 kg)   05/15/12 157 lb (71.215 kg)    Usual Body Weight: 140 lb  % Usual Body Weight: 100%  BMI:  21.6 (using wt of 140 lb) - stable  Estimated Nutritional Needs: Kcal: 1500 - 1700 Protein: 70 - 80 g Fluid: 1.5 - 1.7 liters  Skin: intact  Diet Order: General  EDUCATION NEEDS: -No education needs identified at this time  No intake or output data in the 24 hours ending 12/24/13 0950  Last BM: 1/20  Labs:   Recent Labs Lab 12/18/13 1515 12/18/13 2200 12/19/13 0545 12/24/13 0320  NA 137  --  137 138  K 3.7  --  3.7 3.4*  CL 98  --  98 98  CO2 25  --  24 23  BUN 8  --  11 14  CREATININE 3.04* 3.36* 3.39* 2.75*  CALCIUM 8.6  --  8.6 9.2  GLUCOSE 90  --  90 103*    CBG (last 3)  No results found for this basename: GLUCAP,  in the last 72 hours  Scheduled Meds: . clindamycin  300 mg Oral TID  . heparin  5,000 Units Subcutaneous Q8H    Continuous Infusions: . sodium chloride      Past Medical History  Diagnosis Date  . No pertinent past medical history     Past Surgical History  Procedure Laterality Date  . No past surgeries      Jarold MottoSamantha Tanaia Hawkey MS, RD, LDN  Pager: [phone] After-hours pager: [phone]

## 2013-12-24 NOTE — Progress Notes (Signed)
Received report from James,RN

## 2013-12-24 NOTE — Progress Notes (Signed)
Nutrition Brief Note  RN paged RD regarding current supplement order. Currently on a Clear Liquid diet order. RD completed full nutrition assessment earlier today. RN states that pt would like [employer] Breeze. Will order for patient at this time.  RD to continue to follow.  Amanda [name] MS, RD, LDN Pager: [phone] After-hours pager: [phone]

## 2013-12-24 NOTE — ED Provider Notes (Signed)
CSN: [id number]     Arrival date & time 12/24/13  0211 History   First MD Initiated Contact with Patient 12/24/13 0240     Chief Complaint  Patient presents with  . Back Pain  . Nausea   (Consider location/radiation/quality/duration/timing/severity/associated sxs/prior Treatment) HPI Comments: 23 year old female presenting with bilateral flank pain, worse on the left. Present for 4 days. Associated with nausea and vomiting. She reports decreased by mouth intake.  She was admitted to the hospital a week ago for facial cellulitis associated with a dental abscess. During this hospitalization, she was found to have acute renal failure. She left the hospital AMA prior to this issue being resolved. Since discharge, she did not take her clindamycin for several days. She restarted this medication today. She has not followed up for a repeat check of her renal function since discharge.  Patient is a 23 y.o. female presenting with flank pain.  Flank Pain This is a new problem. Episode onset: 4 days ago. The problem occurs constantly. The problem has been gradually worsening. Associated symptoms include abdominal pain (suprapubic) and shortness of breath (occasional). Pertinent negatives include no chest pain. Nothing aggravates the symptoms. Nothing relieves the symptoms. She has tried nothing for the symptoms.    Past Medical History  Diagnosis Date  . No pertinent past medical history    Past Surgical History  Procedure Laterality Date  . No past surgeries     Family History  Problem Relation Age of Onset  . Diabetes Mother   . Hypertension Mother   . Stroke Mother   . Heart disease Mother   . Diabetes Maternal Grandmother   . Cancer Father    History  Substance Use Topics  . Smoking status: Former Smoker    Quit date: 06/26/2012  . Smokeless tobacco: Never Used  . Alcohol Use: No   OB History   Grav Para Term Preterm Abortions TAB SAB Ect Mult Living   1 1 1       1      Review  of Systems  Constitutional: Negative for fever.  HENT: Negative for congestion.   Respiratory: Positive for shortness of breath (occasional). Negative for cough.   Cardiovascular: Negative for chest pain.  Gastrointestinal: Positive for nausea, vomiting and abdominal pain (suprapubic). Negative for diarrhea.  Genitourinary: Positive for flank pain.  Musculoskeletal: Positive for back pain.  All other systems reviewed and are negative.    Allergies  Nickel  Home Medications   Current Outpatient Rx  Name  Route  Sig  Dispense  Refill  . acetaminophen (TYLENOL) 500 MG tablet   Oral   Take 1 tablet (500 mg total) by mouth every 4 (four) hours as needed for moderate pain or headache.   30 tablet   0   . clindamycin (CLEOCIN) 300 MG capsule   Oral   Take 1 capsule (300 mg total) by mouth 3 (three) times daily. For 7days   21 capsule   0    BP 135/75  Pulse 95  Temp(Src) 99.4 F (37.4 C) (Oral)  Resp 20  SpO2 100%  LMP 12/23/2013 Physical Exam  Nursing note and vitals reviewed. Constitutional: She is oriented to person, place, and time. She appears well-developed and well-nourished. No distress.  HENT:  Head: Normocephalic and atraumatic.  Mouth/Throat: Oropharynx is clear and moist.  Eyes: Conjunctivae are normal. Pupils are equal, round, and reactive to light. No scleral icterus.  Neck: Neck supple.  Cardiovascular: Normal rate, regular rhythm, normal  heart sounds and intact distal pulses.   No murmur heard. Pulmonary/Chest: Effort normal and breath sounds normal. No stridor. No respiratory distress. She has no rales.  Abdominal: Soft. Bowel sounds are normal. She exhibits no distension. There is tenderness (mild) in the suprapubic area. There is no rigidity, no rebound and no guarding.  Musculoskeletal: Normal range of motion. She exhibits no edema.       Thoracic back: She exhibits no tenderness.       Lumbar back: She exhibits no tenderness.  Neurological: She is  alert and oriented to person, place, and time.  Skin: Skin is warm and dry. No rash noted.  Psychiatric: She has a normal mood and affect. Her behavior is normal.    ED Course  Procedures (including critical care time) Labs Review Labs Reviewed  CBC WITH DIFFERENTIAL - Abnormal; Notable for the following:    WBC 12.7 (*)    Hemoglobin 10.7 (*)    HCT 31.3 (*)    MCV 77.9 (*)    Platelets 407 (*)    Neutro Abs 8.5 (*)    Monocytes Relative 13 (*)    Monocytes Absolute 1.7 (*)    All other components within normal limits  COMPREHENSIVE METABOLIC PANEL - Abnormal; Notable for the following:    Potassium 3.4 (*)    Glucose, Bld 103 (*)    Creatinine, Ser 2.75 (*)    Albumin 3.1 (*)    GFR calc non Af Amer 23 (*)    GFR calc Af Amer 27 (*)    All other components within normal limits  URINALYSIS, ROUTINE W REFLEX MICROSCOPIC - Abnormal; Notable for the following:    Hgb urine dipstick TRACE (*)    Leukocytes, UA SMALL (*)    All other components within normal limits  URINE MICROSCOPIC-ADD ON - Abnormal; Notable for the following:    Squamous Epithelial / LPF FEW (*)    All other components within normal limits  LIPASE, BLOOD   Imaging Review No results found.  EKG Interpretation   None       MDM  1. Acute Renal Failure  23 yo female presenting with nausea, vomiting, decreased PO intake, and bilateral flank pain.  She left AMA after being admitted for facial cellulitis and developing acute renal failure.  Her facial swelling has improved since that time.  Her labs today show persistent renal failure.  Discussed case with Dr. Julian Reil, who will admit for further management.      Candyce Churn, MD 12/24/13 [phone]

## 2013-12-24 NOTE — ED Notes (Signed)
Per EMS, pt c/o of lower back pain x4 days. [facility] abscess and had a "reaction" to the medication and was admitted to the hospital for 3 days. Pt reports nausea and vomiting x4days.

## 2013-12-25 DIAGNOSIS — K047 Periapical abscess without sinus: Secondary | ICD-10-CM

## 2013-12-25 LAB — CBC
HEMATOCRIT: 29.6 % — AB (ref 36.0–46.0)
Hemoglobin: 10 g/dL — ABNORMAL LOW (ref 12.0–15.0)
MCH: 26.3 pg (ref 26.0–34.0)
MCHC: 33.8 g/dL (ref 30.0–36.0)
MCV: 77.9 fL — ABNORMAL LOW (ref 78.0–100.0)
Platelets: 424 10*3/uL — ABNORMAL HIGH (ref 150–400)
RBC: 3.8 MIL/uL — AB (ref 3.87–5.11)
RDW: 12.9 % (ref 11.5–15.5)
WBC: 11 10*3/uL — AB (ref 4.0–10.5)

## 2013-12-25 LAB — URINE CULTURE: Colony Count: 5000

## 2013-12-25 LAB — BASIC METABOLIC PANEL
BUN: 14 mg/dL (ref 6–23)
CHLORIDE: 102 meq/L (ref 96–112)
CO2: 23 mEq/L (ref 19–32)
Calcium: 8.6 mg/dL (ref 8.4–10.5)
Creatinine, Ser: 2.64 mg/dL — ABNORMAL HIGH (ref 0.50–1.10)
GFR calc non Af Amer: 24 mL/min — ABNORMAL LOW (ref >90)
GFR, EST AFRICAN AMERICAN: 28 mL/min — AB (ref >90)
GLUCOSE: 108 mg/dL — AB (ref 70–99)
Potassium: 4 mEq/L (ref 3.7–5.3)
SODIUM: 142 meq/L (ref 137–147)

## 2013-12-25 MED ORDER — PROMETHAZINE HCL 25 MG/ML IJ SOLN
25.0000 mg | Freq: Once | INTRAMUSCULAR | Status: AC
  Administered 2013-12-25: 25 mg via INTRAVENOUS

## 2013-12-25 NOTE — Progress Notes (Signed)
[facility] PROGRESS NOTE  Amanda Beasley ZOX:[id number] DOB: 1991-01-11 DOA: 12/24/2013 PCP: Default, Provider, MD   Brief narrative 23 y.o. female who just left AMA from our service at [facility] hospital on 1/15. She had been being treated for facial cellulitis and periapical abscess (which has gotten better); however, the patients stay had been complicated by AKI that she developed suddenly on 1/14 associated with an elevated vanc trough of 27. It was believed that the patient is suffering from vancomycin nephrotoxicity, and plans made to hydrate, stop vanc, and follow renal function; however, the patient left AMA at that time.  She presents with interval development of bilateral flank pain, radiating down into her abdomen and describes CVA tenderness as well. Work up in the ED shows slight improvement in kidney function with a creatinine of 2.45 down from 3.39 at [facility] discharge.  Assessment/Plan: Acute kidney injury Likely in the setting of vancomycin-induced nephrotoxicity. Renal ultrasound unremarkable. Renal function has been noted to be improving. Continue IV fluids and monitor  Monitor urine output  UTI Added empiric rocephin. Follow cx. B/l flank pain on admission could be due to acute pyelonephritis. US unremarkable. Flank pain has now resolved. When necessary Zofran for nausea.  Periapical  Abscess  improving on clindamycin.has apointment on Friday for definitive management with dentist.  Hypokalemia replenished  Code Status: full code Family Communication: none at  bedside Disposition Plan: home in 1-2 days if renal fn continues to improve    Consultants:  None  Procedures:  None  Antibiotics:  Clindamycin and Rocephin  HPI/Subjective: Patient seen and examined this morning. Denies flank pain. Has off-and-on nausea  Objective: Filed Vitals:   12/25/13 0434  BP: 121/78  Pulse: 67  Temp: 98.6 F (37 C)  Resp: 18    Intake/Output Summary (Last 24  hours) at 12/25/13 1345 Last data filed at 12/24/13 2109  Gross per 24 hour  Intake    937 ml  Output   1325 ml  Net   -388 ml   Filed Weights   12/24/13 0730  Weight: 50.5 kg (111 lb 5.3 oz)    Exam:   General:  Young female in no acute distress  HEENT: No pallor, moist oral mucosa, mild swelling of right upper molar area  Chest: Clear to auscultation bilaterally, no added sounds  CVS: Normal S1 and S2, no murmurs or gallop  Abdomen: Soft, nontender, nondistended, bowel sounds present  Extremities: Warm, no edema  CNS: AAO x3     Data Reviewed: Basic Metabolic Panel:  Recent Labs Lab 12/18/13 1515 12/18/13 2200 12/19/13 0545 12/24/13 0320 12/25/13 0645  NA 137  --  137 138 142  K 3.7  --  3.7 3.4* 4.0  CL 98  --  98 98 102  CO2 25  --  24 23 23   GLUCOSE 90  --  90 103* 108*  BUN 8  --  11 14 14   CREATININE 3.04* 3.36* 3.39* 2.75* 2.64*  CALCIUM 8.6  --  8.6 9.2 8.6   Liver Function Tests:  Recent Labs Lab 12/24/13 0320  AST 11  ALT 6  ALKPHOS 65  BILITOT 0.3  PROT 7.9  ALBUMIN 3.1*    Recent Labs Lab 12/24/13 0320  LIPASE 28   No results found for this basename: AMMONIA,  in the last 168 hours CBC:  Recent Labs Lab 12/19/13 0545 12/24/13 0320 12/25/13 0645  WBC 9.2 12.7* 11.0*  NEUTROABS  --  8.5*  --  HGB 10.0* 10.7* 10.0*  HCT 31.0* 31.3* 29.6*  MCV 79.3 77.9* 77.9*  PLT 328 407* 424*   Cardiac Enzymes: No results found for this basename: CKTOTAL, CKMB, CKMBINDEX, TROPONINI,  in the last 168 hours BNP (last 3 results) No results found for this basename: PROBNP,  in the last 8760 hours CBG: No results found for this basename: GLUCAP,  in the last 168 hours  Recent Results (from the past 240 hour(s))  MRSA PCR SCREENING     Status: None   Collection Time    12/16/13 11:04 AM      Result Value Range Status   MRSA by PCR NEGATIVE  NEGATIVE Final   Comment:            The GeneXpert MRSA Assay (FDA     approved for NASAL  specimens     only), is one component of a     comprehensive MRSA colonization     surveillance program. It is not     intended to diagnose MRSA     infection nor to guide or     monitor treatment for     MRSA infections.  URINE CULTURE     Status: None   Collection Time    12/24/13 11:50 AM      Result Value Range Status   Specimen Description URINE, RANDOM   Final   Special Requests NONE   Final   Culture  Setup Time     Final   Value: 12/24/2013 12:17     Performed at [facility]   Colony Count     Final   Value: 5,000 COLONIES/ML     Performed at [facility]   Culture     Final   Value: INSIGNIFICANT GROWTH     Performed at [facility]   Report Status 12/25/2013 FINAL   Final     Studies: [place] Renal  12/24/2013   CLINICAL DATA:  Acute renal insufficiency.  EXAM: RENAL/URINARY TRACT ULTRASOUND COMPLETE  COMPARISON:  None.  FINDINGS: Right Kidney:  Length: 13.6 cm. Mildly increased renal parenchymal echogenicity noted. No mass or hydronephrosis visualized.  Left Kidney:  Length: 14.2 cm. Mildly increased renal parenchymal echogenicity noted. No mass or hydronephrosis visualized.  Bladder:  Appears normal for degree of bladder distention. Bilateral ureteral jets are visualized.  IMPRESSION: 1. No evidence of hydronephrosis.  Bilateral ureteral jets are seen. 2. Mildly increased renal parenchymal echogenicity may reflect medical renal disease.   Electronically Signed   By: Roanna RaiderJeffery  Chang M.D.   On: 12/24/2013 06:15    Scheduled Meds: . cefTRIAXone (ROCEPHIN)  IV  1 g Intravenous Q24H  . clindamycin (CLEOCIN) IV  300 mg Intravenous Q8H  . feeding supplement (RESOURCE BREEZE)  1 Container Oral BID BM  . heparin  5,000 Units Subcutaneous Q8H   Continuous Infusions: . sodium chloride 125 mL/hr at 12/25/13 0503     Time spent: 25 minutes    Toshua Honsinger  [facility] Pager [phone]. If 7PM-7AM, please contact night-coverage at  [email or web address], password [email or web address] 12/25/2013, 1:45 PM  LOS: 1 day

## 2013-12-26 DIAGNOSIS — R112 Nausea with vomiting, unspecified: Secondary | ICD-10-CM | POA: Diagnosis not present

## 2013-12-26 LAB — BASIC METABOLIC PANEL
BUN: 16 mg/dL (ref 6–23)
CO2: 22 meq/L (ref 19–32)
CREATININE: 2.35 mg/dL — AB (ref 0.50–1.10)
Calcium: 8.6 mg/dL (ref 8.4–10.5)
Chloride: 106 mEq/L (ref 96–112)
GFR calc Af Amer: 32 mL/min — ABNORMAL LOW (ref >90)
GFR calc non Af Amer: 28 mL/min — ABNORMAL LOW (ref >90)
Glucose, Bld: 94 mg/dL (ref 70–99)
Potassium: 3.8 mEq/L (ref 3.7–5.3)
Sodium: 145 mEq/L (ref 137–147)

## 2013-12-26 MED ORDER — METOCLOPRAMIDE HCL 5 MG/ML IJ SOLN
5.0000 mg | INTRAMUSCULAR | Status: DC | PRN
  Administered 2013-12-26 – 2013-12-28 (×10): 5 mg via INTRAVENOUS
  Filled 2013-12-26 (×11): qty 1

## 2013-12-26 MED ORDER — PROMETHAZINE HCL 25 MG/ML IJ SOLN: 25.0000 mg | Freq: Four times a day (QID) | INTRAMUSCULAR | Status: DC | PRN

## 2013-12-26 NOTE — Progress Notes (Signed)
Pt c/o nausea  earlier in shift. Informed pt that RN would return with prn phenergan when it was due again at 0130. Pt in agreement, asked for sprite and pitcher of water. When returned to pt room, pt was resting comfortably. Returned phenergan to pyxis and will continue to monitor pt.

## 2013-12-26 NOTE — Progress Notes (Signed)
[facility] PROGRESS NOTE  Liam GrahamCetaria C Edwards ONG:[id number] DOB: Jul 09, 1991 DOA: 12/24/2013 PCP: Default, Provider, MD   Brief narrative 23 y.o. female who [facility]. She had been being treated for facial cellulitis and periapical abscess (which has gotten better); however, the patients stay had been complicated by AKI that she developed suddenly on 1/14 associated with an elevated vanc trough of 27. It was believed that the patient is suffering from vancomycin nephrotoxicity, and plans made to hydrate, stop vanc, and follow renal function; however, the patient left AMA at that time.  She presents with interval development of bilateral flank pain, radiating down into her abdomen and describes CVA tenderness as well. Work up in the ED shows slight improvement in kidney function with a creatinine of 2.45 [facility].  Assessment/Plan: Acute kidney injury Likely in the setting of vancomycin-induced nephrotoxicity. Renal ultrasound unremarkable. Renal function has been improving. Continue IV fluids and monitor  Monitor urine output  UTI Added empiric rocephin. CX growing mixes bacteria. B/l flank pain on admission could be due to acute pyelonephritis. US unremarkable. Flank pain has now resolved.   nausea and vomiting  on prn phenergan. Active symptoms. Added prn reglan. Normal abdominal exam.   Periapical  Abscess  improving on clindamycin.has apointment on Friday for definitive management with dentist.  Hypokalemia replenished  Code Status: full code Family Communication: none at  bedside Disposition Plan: home in am  renal fn continues to improve    Consultants:  None  Procedures:  None  Antibiotics:  Clindamycin and Rocephin  HPI/Subjective: Patient seen and examined this morning. Has active nausea and vomiting  Objective: Filed Vitals:   12/26/13 0550  BP: 134/88  Pulse: 74  Temp: 98.3 F  (36.8 C)  Resp: 18    Intake/Output Summary (Last 24 hours) at 12/26/13 1219 Last data filed at 12/26/13 [id number]  Gross per 24 hour  Intake    720 ml  Output    950 ml  Net   -230 ml   Filed Weights   12/24/13 0730  Weight: 50.5 kg (111 lb 5.3 oz)    Exam:   General:  Young female vomiting in bed.  HEENT: No pallor, moist oral mucosa, mild swelling of right upper molar area  Chest: Clear to auscultation bilaterally, no added sounds  CVS: Normal S1 and S2, no murmurs or gallop  Abdomen: Soft, nontender, nondistended, bowel sounds present  Extremities: Warm, no edema  CNS: AAO x3     Data Reviewed: Basic Metabolic Panel:  Recent Labs Lab 12/24/13 0320 12/25/13 0645 12/26/13 0406  NA 138 142 145  K 3.4* 4.0 3.8  CL 98 102 106  CO2 23 23 22   GLUCOSE 103* 108* 94  BUN 14 14 16   CREATININE 2.75* 2.64* 2.35*  CALCIUM 9.2 8.6 8.6   Liver Function Tests:  Recent Labs Lab 12/24/13 0320  AST 11  ALT 6  ALKPHOS 65  BILITOT 0.3  PROT 7.9  ALBUMIN 3.1*    Recent Labs Lab 12/24/13 0320  LIPASE 28   No results found for this basename: AMMONIA,  in the last 168 hours CBC:  Recent Labs Lab 12/24/13 0320 12/25/13 0645  WBC 12.7* 11.0*  NEUTROABS 8.5*  --   HGB 10.7* 10.0*  HCT 31.3* 29.6*  MCV 77.9* 77.9*  PLT 407* 424*   Cardiac Enzymes: No results found for this basename: CKTOTAL, CKMB, CKMBINDEX, TROPONINI,  in the last 168 hours BNP (last 3 results) No results found for this basename: PROBNP,  in the last 8760 hours CBG: No results found for this basename: GLUCAP,  in the last 168 hours  Recent Results (from the past 240 hour(s))  URINE CULTURE     Status: None   Collection Time    12/24/13 11:50 AM      Result Value Range Status   Specimen Description URINE, RANDOM   Final   Special Requests NONE   Final   Culture  Setup Time     Final   Value: 12/24/2013 12:17     Performed at [facility] Count     Final   Value:  5,000 COLONIES/ML     Performed at [facility]   Culture     Final   Value: INSIGNIFICANT GROWTH     Performed at [facility]   Report Status 12/25/2013 FINAL   Final     Studies: No results found.  Scheduled Meds: . cefTRIAXone (ROCEPHIN)  IV  1 g Intravenous Q24H  . clindamycin (CLEOCIN) IV  300 mg Intravenous Q8H  . feeding supplement (RESOURCE BREEZE)  1 Container Oral BID BM  . heparin  5,000 Units Subcutaneous Q8H   Continuous Infusions: . sodium chloride 125 mL/hr at 12/25/13 0503     Time spent: 25 minutes    Kavontae Pritchard  [facility] Pager [phone]. If 7PM-7AM, please contact night-coverage at [email or web address], password [facility] 12/26/2013, 12:19 PM  LOS: 2 days

## 2013-12-26 NOTE — Progress Notes (Signed)
Pt moaning in sleep. Pt c/o no nausea, vomiting or pain, "just sleepy."

## 2013-12-27 DIAGNOSIS — E876 Hypokalemia: Secondary | ICD-10-CM

## 2013-12-27 LAB — BASIC METABOLIC PANEL
BUN: 14 mg/dL (ref 6–23)
CALCIUM: 8.7 mg/dL (ref 8.4–10.5)
CO2: 20 meq/L (ref 19–32)
Chloride: 103 mEq/L (ref 96–112)
Creatinine, Ser: 1.91 mg/dL — ABNORMAL HIGH (ref 0.50–1.10)
GFR calc Af Amer: 41 mL/min — ABNORMAL LOW (ref >90)
GFR calc non Af Amer: 36 mL/min — ABNORMAL LOW (ref >90)
GLUCOSE: 96 mg/dL (ref 70–99)
Potassium: 3.3 mEq/L — ABNORMAL LOW (ref 3.7–5.3)
SODIUM: 140 meq/L (ref 137–147)

## 2013-12-27 LAB — CBC
HEMATOCRIT: 32.4 % — AB (ref 36.0–46.0)
Hemoglobin: 11 g/dL — ABNORMAL LOW (ref 12.0–15.0)
MCH: 26.2 pg (ref 26.0–34.0)
MCHC: 34 g/dL (ref 30.0–36.0)
MCV: 77.1 fL — AB (ref 78.0–100.0)
Platelets: 500 10*3/uL — ABNORMAL HIGH (ref 150–400)
RBC: 4.2 MIL/uL (ref 3.87–5.11)
RDW: 12.7 % (ref 11.5–15.5)
WBC: 10.1 10*3/uL (ref 4.0–10.5)

## 2013-12-27 LAB — CLOSTRIDIUM DIFFICILE BY PCR: Toxigenic C. Difficile by PCR: NEGATIVE

## 2013-12-27 MED ORDER — PROMETHAZINE HCL 25 MG/ML IJ SOLN: 12.5000 mg | Freq: Four times a day (QID) | INTRAMUSCULAR | Status: DC | PRN

## 2013-12-27 MED ORDER — POTASSIUM CHLORIDE CRYS ER 20 MEQ PO TBCR
40.0000 meq | EXTENDED_RELEASE_TABLET | Freq: Once | ORAL | Status: AC
  Administered 2013-12-27: 40 meq via ORAL
  Filled 2013-12-27: qty 2

## 2013-12-27 NOTE — Care Management Note (Signed)
    Page 1 of 1   12/27/2013     4:14:49 PM   CARE MANAGEMENT NOTE 12/27/2013  Patient:  Amanda Beasley,Amanda Beasley   Account Number:  [id number]  Date Initiated:  12/27/2013  Documentation initiated by:  Letha CapeAYLOR,Ericka Marcellus  Subjective/Objective Assessment:   dx arf  admit- from home.     Action/Plan:   Anticipated DC Date:  12/28/2013   Anticipated DC Plan:  HOME/SELF CARE      DC Planning Services  CM consult      Choice offered to / List presented to:             Status of service:  In process, will continue to follow Medicare Important Message given?   (If response is "NO", the following Medicare IM given date fields will be blank) Date Medicare IM given:   Date Additional Medicare IM given:    Discharge Disposition:    Per UR Regulation:  Reviewed for med. necessity/level of care/duration of stay  If discussed at Long Length of Stay Meetings, dates discussed:    Comments:  12/27/13 16:13 Letha Capeeborah Tareva Leske RN, BSN [phone] patient conts with n/v, once able to tolerate diet plan is to dc home.

## 2013-12-27 NOTE — Progress Notes (Signed)
Pt has regular diet ordered, but pt has not attempted to eat anything. Has subway sandwich in room, but states that she hasn't touched it. Still feeling nauseous, states reglan is helping . Will continue to monitor pt.

## 2013-12-27 NOTE — Progress Notes (Signed)
[facility] PROGRESS NOTE  Amanda Beasley ZOX:[id number] DOB: [birth year] DOA: 12/24/2013 PCP: Default, Provider, MD  Brief narrative  23 y.o. female who just left AMA from our service at [facility] hospital on 1/15. She had been being treated for facial cellulitis and periapical abscess (which has gotten better); however, the patients stay had been complicated by AKI that she developed suddenly on 1/14 associated with an elevated vanc trough of 27. It was believed that the patient is suffering from vancomycin nephrotoxicity, and plans made to hydrate, stop vanc, and follow renal function; however, the patient left AMA at that time.  She presents with interval development of bilateral flank pain, radiating down into her abdomen and describes CVA tenderness as well. Work up in the ED shows slight improvement in kidney function with a creatinine of 2.45 down from 3.39 at [facility] discharge.   Assessment/Plan:  Acute kidney injury  Likely in the setting of vancomycin-induced nephrotoxicity. Renal ultrasound unremarkable. Renal function has been improving. Continue IV fluids and monitor  Stable urine output   UTI  On empiric rocephin. CX growing mixes bacteria. B/l flank pain on admission could be due to acute pyelonephritis. US unremarkable. Flank pain has now resolved. Will treat for total 7 days.  nausea and vomiting  on prn phenergan. Active symptoms. Added prn reglan. Normal abdominal exam. Also complains of diarrhea since this am. will check cbc and stool for c diff given active abx use.  liekly due to viral gastroenteritis/ possible  for cyclic vomiting due to active cannabis use.  Periapical Abscess  improving on clindamycin.has apointment on Friday for definitive management with dentist.   Hypokalemia  replenished   Code Status: full code  Family Communication: none at bedside  Disposition Plan: home once nausea and vomiting resolved.  Consultants:  None Procedures:   None Antibiotics:  Clindamycin and Rocephin (1/20>>)  HPI/Subjective:  Patient seen and examined this morning.still has  active nausea and vomiting. Also  c/o 2-3 episodes of diarrhea today.   Objective: Filed Vitals:   12/27/13 0434  BP: 132/82  Pulse:   Temp:   Resp:     Intake/Output Summary (Last 24 hours) at 12/27/13 1339 Last data filed at 12/26/13 2239  Gross per 24 hour  Intake 7046.67 ml  Output    500 ml  Net 6546.67 ml   Filed Weights   12/24/13 0730  Weight: 50.5 kg (111 lb 5.3 oz)    Exam:  General: Young female vomiting appears fatigued  HEENT: No pallor, dry oral mucosa, mild swelling of right upper molar area  Chest: Clear to auscultation bilaterally, no added sounds  CVS: Normal S1 and S2, no murmurs or gallop  Abdomen: Soft, nontender, nondistended, bowel sounds present  Extremities: Warm, no edema  CNS: AAO x3    Data Reviewed: Basic Metabolic Panel:  Recent Labs Lab 12/24/13 0320 12/25/13 0645 12/26/13 0406 12/27/13 0610  NA 138 142 145 140  K 3.4* 4.0 3.8 3.3*  CL 98 102 106 103  CO2 23 23 22 20   GLUCOSE 103* 108* 94 96  BUN 14 14 16 14   CREATININE 2.75* 2.64* 2.35* 1.91*  CALCIUM 9.2 8.6 8.6 8.7   Liver Function Tests:  Recent Labs Lab 12/24/13 0320  AST 11  ALT 6  ALKPHOS 65  BILITOT 0.3  PROT 7.9  ALBUMIN 3.1*    Recent Labs Lab 12/24/13 0320  LIPASE 28   No results found for this basename: AMMONIA,  in the last 168  hours CBC:  Recent Labs Lab 12/24/13 0320 12/25/13 0645  WBC 12.7* 11.0*  NEUTROABS 8.5*  --   HGB 10.7* 10.0*  HCT 31.3* 29.6*  MCV 77.9* 77.9*  PLT 407* 424*   Cardiac Enzymes: No results found for this basename: CKTOTAL, CKMB, CKMBINDEX, TROPONINI,  in the last 168 hours BNP (last 3 results) No results found for this basename: PROBNP,  in the last 8760 hours CBG: No results found for this basename: GLUCAP,  in the last 168 hours  Recent Results (from the past 240 hour(s))  URINE  CULTURE     Status: None   Collection Time    12/24/13 11:50 AM      Result Value Range Status   Specimen Description URINE, RANDOM   Final   Special Requests NONE   Final   Culture  Setup Time     Final   Value: 12/24/2013 12:17     Performed at [facility]   Colony Count     Final   Value: 5,000 COLONIES/ML     Performed at [facility]   Culture     Final   Value: INSIGNIFICANT GROWTH     Performed at [facility]   Report Status 12/25/2013 FINAL   Final     Studies: No results found.  Scheduled Meds: . cefTRIAXone (ROCEPHIN)  IV  1 g Intravenous Q24H  . clindamycin (CLEOCIN) IV  300 mg Intravenous Q8H  . feeding supplement (RESOURCE BREEZE)  1 Container Oral BID BM  . heparin  5,000 Units Subcutaneous Q8H   Continuous Infusions: . sodium chloride 125 mL/hr at 12/27/13 [id number]      Time spent: 25 MINUTES    Amanda Beasley  [facility] Pager [phone] If 7PM-7AM, please contact night-coverage at [email or web address], password [email or web address] 12/27/2013, 1:39 PM  LOS: 3 days

## 2013-12-28 LAB — BASIC METABOLIC PANEL
BUN: 9 mg/dL (ref 6–23)
CHLORIDE: 104 meq/L (ref 96–112)
CO2: 22 meq/L (ref 19–32)
CREATININE: 1.6 mg/dL — AB (ref 0.50–1.10)
Calcium: 8.7 mg/dL (ref 8.4–10.5)
GFR calc Af Amer: 51 mL/min — ABNORMAL LOW (ref >90)
GFR calc non Af Amer: 44 mL/min — ABNORMAL LOW (ref >90)
GLUCOSE: 84 mg/dL (ref 70–99)
POTASSIUM: 3.4 meq/L — AB (ref 3.7–5.3)
Sodium: 143 mEq/L (ref 137–147)

## 2013-12-28 MED ORDER — PROMETHAZINE HCL 12.5 MG PO TABS
12.5000 mg | ORAL_TABLET | Freq: Four times a day (QID) | ORAL | Status: DC | PRN
Start: 2013-12-28 — End: 2014-11-07

## 2013-12-28 NOTE — Progress Notes (Signed)
Pt given discharge instructions and prescription.  PIV removed.  Pt denied any other needs. Pt escorted to discharge location.

## 2013-12-28 NOTE — Discharge Summary (Signed)
Physician Discharge Summary  Amanda Beasley ZOX:[id number] DOB: 01/14/1991 DOA: 12/24/2013  PCP: Burtis Junes, MD  Admit date: 12/24/2013 Discharge date: 12/28/2013  Time spent: greater than 30 min  Recommendations for Outpatient Follow-up:  1. F/u with dentist or oral surgeon for extraction 2. Monitor BMET  Discharge Diagnoses:  Principal Problem:   Acute renal failure Active Problems:   Periapical abscess with facial involvement   Vancomycin-induced nephrotoxicity   Nausea and vomiting   Discharge Condition: stable  Filed Weights   12/24/13 0730  Weight: 50.5 kg (111 lb 5.3 oz)    History of present illness:  23 y.o. female who just left AMA from our service at [facility] hospital on 1/15. She had been being treated for facial cellulitis and periapical abscess (which has gotten better); however, the patients stay had been complicated by AKI that she developed suddenly on the 14th associated with an elevated vanc trough of 27. It was believed that the patient is suffering from vancomycin nephrotoxicity, and plans made to hydrate, stop vanc, and follow renal function; however, the patient left AMA at that time.  She presents with interval development of bilateral flank pain, radiating down into her abdomen and describes CVA tenderness as well. Work up in the ED shows slight improvement in kidney function with a creatinine of 2.45 down from 3.39 at [facility] discharge.   Hospital Course:  Given IVF, iv clindamycin. Supportive care. Creatinine near baseline at discharge.  Minimal induration of right face at discharge. No erythema or warmth. Has rescheduled appointment with dentist.  Requesting discharge  Procedures:  none  Consultations:  none  Discharge Exam: Filed Vitals:   12/28/13 0524  BP: 156/89  Pulse: 70  Temp: 97.9 F (36.6 C)  Resp: 20    General: alert, oriented, nontoxic Face with minimal swelling near nasolabial fold   Discharge  Instructions  Discharge Orders   Future Orders Complete By Expires   Activity as tolerated - No restrictions  As directed    Diet general  As directed    Discharge instructions  As directed    Comments:     Drink plenty of liquids       Medication List         acetaminophen 500 MG tablet  Commonly known as:  TYLENOL  Take 1 tablet (500 mg total) by mouth every 4 (four) hours as needed for moderate pain or headache.     clindamycin 300 MG capsule  Commonly known as:  CLEOCIN  Take 1 capsule (300 mg total) by mouth 3 (three) times daily. For 7days     promethazine 12.5 MG tablet  Commonly known as:  PHENERGAN  Take 1 tablet (12.5 mg total) by mouth every 6 (six) hours as needed for nausea or vomiting.       Allergies  Allergen Reactions  . Nickel Rash       Follow-up Information   Follow up with Burtis Junes, MD. (to establish primary care)    Specialty:  Family Medicine   Contact information:   [address] [address] [place] [address] [phone]     your dentist as soon as possible   The results of significant diagnostics from this hospitalization (including imaging, microbiology, ancillary and laboratory) are listed below for reference.    Significant Diagnostic Studies: Dg Orthopantogram  12/17/2013   CLINICAL DATA:  Right facial swelling, query periapical pathology.  EXAM: ORTHOPANTOGRAM/PANORAMIC  COMPARISON:  CT MAXILLOFACIAL W/CM dated 12/15/2013  FINDINGS:  Extensive tooth decay in noted. Suspected large cavities in teeth 14, 15, 19, 30, 32, and 2. Periapical lucencies are associated with all of these cavities except for the cavity in tooth number 19.  IMPRESSION: 1. Extensive tooth decay with cavities and periapical lucencies detailed above. Similar appearance to facial CT from 12/15/2013.   Electronically Signed   By: Herbie Baltimore M.D.   On: 12/17/2013 17:01   US Renal  12/24/2013   CLINICAL DATA:  Acute renal insufficiency.  EXAM:  RENAL/URINARY TRACT ULTRASOUND COMPLETE  COMPARISON:  None.  FINDINGS: Right Kidney:  Length: 13.6 cm. Mildly increased renal parenchymal echogenicity noted. No mass or hydronephrosis visualized.  Left Kidney:  Length: 14.2 cm. Mildly increased renal parenchymal echogenicity noted. No mass or hydronephrosis visualized.  Bladder:  Appears normal for degree of bladder distention. Bilateral ureteral jets are visualized.  IMPRESSION: 1. No evidence of hydronephrosis.  Bilateral ureteral jets are seen. 2. Mildly increased renal parenchymal echogenicity may reflect medical renal disease.   Electronically Signed   By: Roanna Raider M.D.   On: 12/24/2013 06:15   Ct Maxillofacial W/cm  12/15/2013   CLINICAL DATA:  23 year old female with right maxillary and facial pain and swelling.  EXAM: CT MAXILLOFACIAL WITH CONTRAST  TECHNIQUE: Multidetector CT imaging of the maxillofacial structures was performed with intravenous contrast. Multiplanar CT image reconstructions were also generated. A small metallic BB was placed on the right temple in order to reliably differentiate right from left.  CONTRAST:  OMNIPAQUE IOHEXOL 300 MG/ML  SOLN  COMPARISON:  None.  FINDINGS: There are multiple dental caries and periapical abscesses, which include teeth #2-6, 14, 15, 19, 30 and 32. There is moderate right facial soft tissue swelling without discrete soft tissue abscess. The periapical abscess of tooth #5 appears to have cortical breakthrough and likely the cause of right facial soft tissue swelling/ cellulitis. There is no evidence of fracture, subluxation or dislocation.  No other focal bony abnormalities are noted.  Mild mucosal thickening within the right maxillary sinus is noted.  Orbits and globes are unremarkable. There is no evidence of postseptal or intraconal abnormality.  The mastoid air cells and inner/ middle ears are clear.  IMPRESSION: Cortical breakthrough of tooth #5 periapical abscess causing right facial soft  tissue swelling/ cellulitis. No discrete soft tissue abscess.  Multiple dental caries and periapical abscesses as discussed above.   Electronically Signed   By: Laveda Abbe M.D.   On: 12/15/2013 07:46    Microbiology: Recent Results (from the past 240 hour(s))  URINE CULTURE     Status: None   Collection Time    12/24/13 11:50 AM      Result Value Range Status   Specimen Description URINE, RANDOM   Final   Special Requests NONE   Final   Culture  Setup Time     Final   Value: 12/24/2013 12:17     Performed at [facility] Count     Final   Value: 5,000 COLONIES/ML     Performed at [facility]   Culture     Final   Value: INSIGNIFICANT GROWTH     Performed at [facility]   Report Status 12/25/2013 FINAL   Final  CLOSTRIDIUM DIFFICILE BY PCR     Status: None   Collection Time    12/27/13  2:49 PM      Result Value Range Status   C difficile by pcr NEGATIVE  NEGATIVE  Final     Labs: Basic Metabolic Panel:  Recent Labs Lab 12/24/13 0320 12/25/13 0645 12/26/13 0406 12/27/13 0610 12/28/13 0550  NA 138 142 145 140 143  K 3.4* 4.0 3.8 3.3* 3.4*  CL 98 102 106 103 104  CO2 23 23 22 20 22   GLUCOSE 103* 108* 94 96 84  BUN 14 14 16 14 9   CREATININE 2.75* 2.64* 2.35* 1.91* 1.60*  CALCIUM 9.2 8.6 8.6 8.7 8.7   Liver Function Tests:  Recent Labs Lab 12/24/13 0320  AST 11  ALT 6  ALKPHOS 65  BILITOT 0.3  PROT 7.9  ALBUMIN 3.1*    Recent Labs Lab 12/24/13 0320  LIPASE 28   No results found for this basename: AMMONIA,  in the last 168 hours CBC:  Recent Labs Lab 12/24/13 0320 12/25/13 0645 12/27/13 1355  WBC 12.7* 11.0* 10.1  NEUTROABS 8.5*  --   --   HGB 10.7* 10.0* 11.0*  HCT 31.3* 29.6* 32.4*  MCV 77.9* 77.9* 77.1*  PLT 407* 424* 500*   Cardiac Enzymes: No results found for this basename: CKTOTAL, CKMB, CKMBINDEX, TROPONINI,  in the last 168 hours BNP: BNP (last 3 results) No results found for this basename: PROBNP,   in the last 8760 hours CBG: No results found for this basename: GLUCAP,  in the last 168 hours     Signed:  Symphani Eckstrom L  [facility] 12/28/2013, 11:42 AM

## 2014-05-05 IMAGING — CT CT MAXILLOFACIAL W/ CM
1 series · 15 of 30 positions shown, 19 images · IV contrast (omnipaque)
Comparison: None.

CLINICAL DATA: 22-year-old female with right maxillary and facial
pain and swelling.

EXAM:
CT MAXILLOFACIAL WITH CONTRAST
TECHNIQUE: Multidetector CT imaging of the maxillofacial structures was
performed with intravenous contrast. Multiplanar CT image
reconstructions were also generated. A small metallic BB was placed
on the right temple in order to reliably differentiate right from
left.
CONTRAST:  100mL OMNIPAQUE IOHEXOL 300 MG/ML  SOLN

[Series 3: facial st · axial · 0.33mm/px · z∈[-198,-38]mm · 15 of 86 slices shown, 19 images]
[im 3/86  brain]
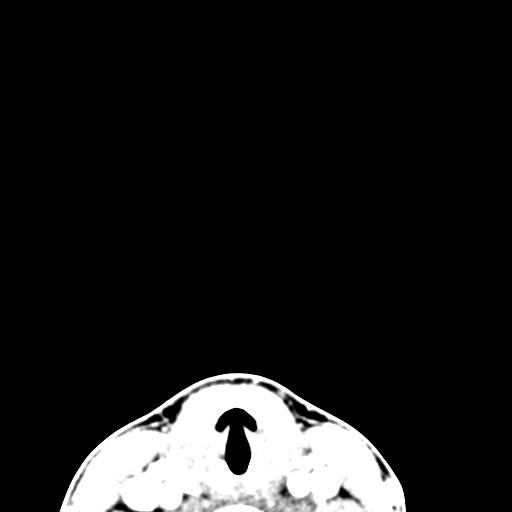
[im 3/86  bone]
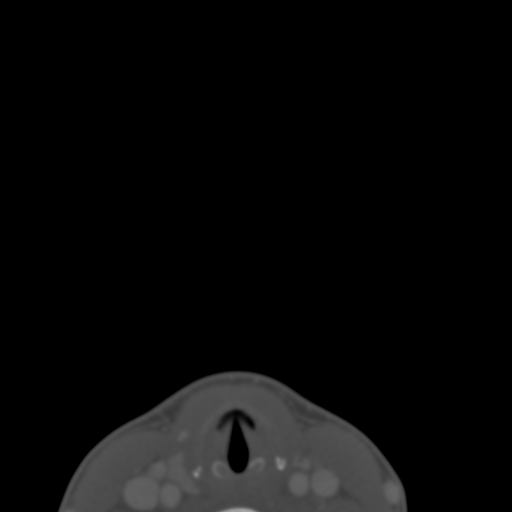
[im 9/86  bone]
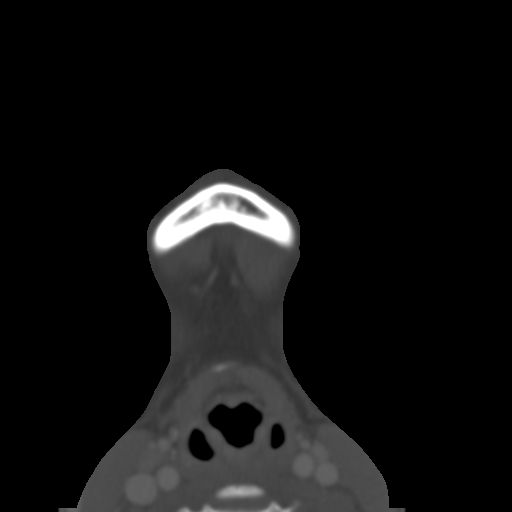
[im 15/86  bone]
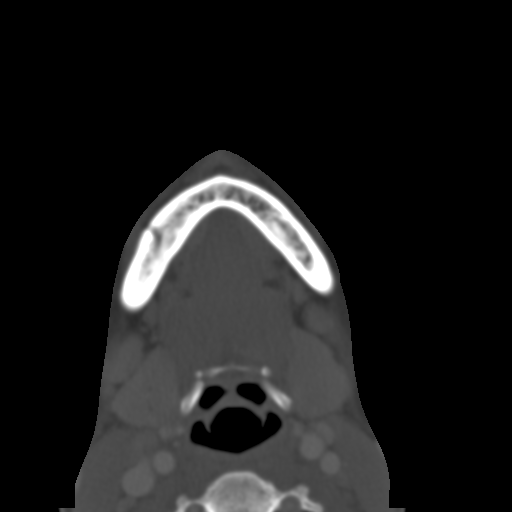
[im 21/86  bone]
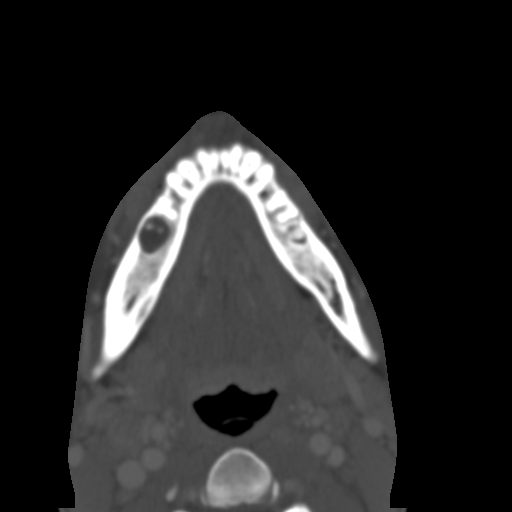
[im 27/86  brain]
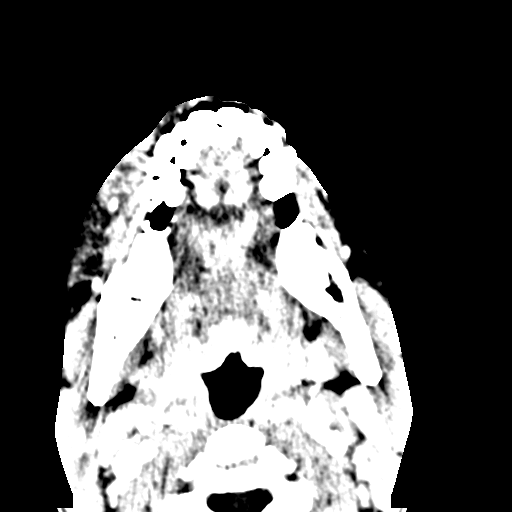
[im 27/86  bone]
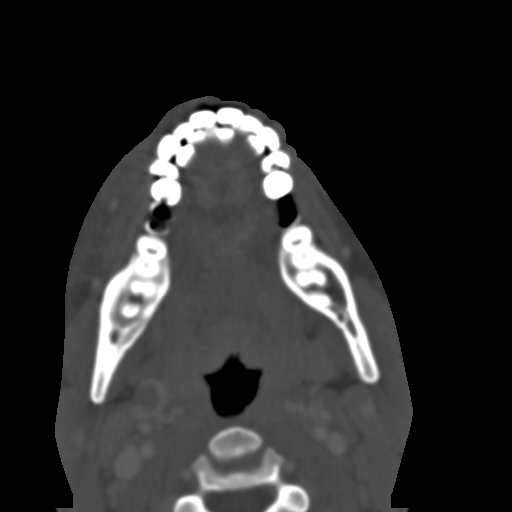
[im 33/86  bone]
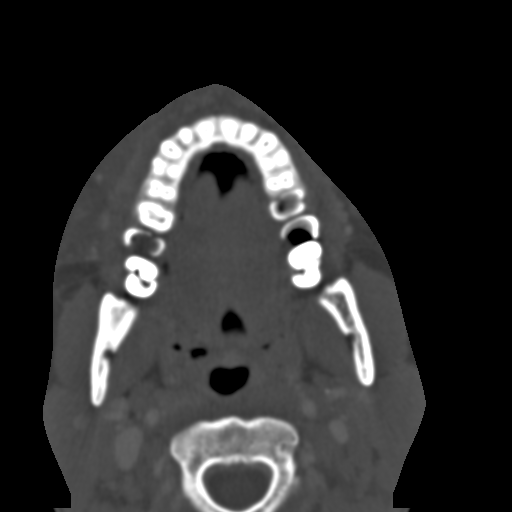
[im 39/86  bone]
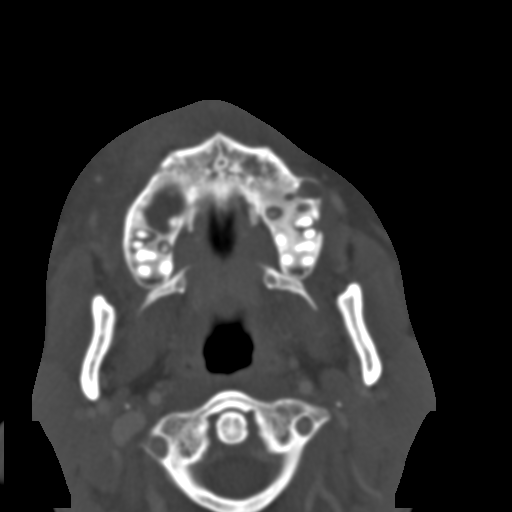
[im 44/86  bone]
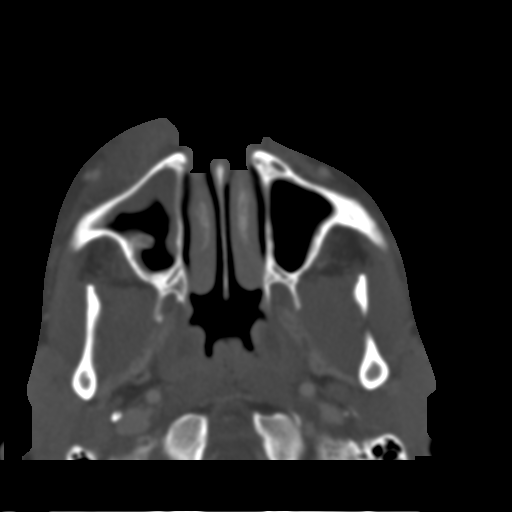
[im 47/86  brain]
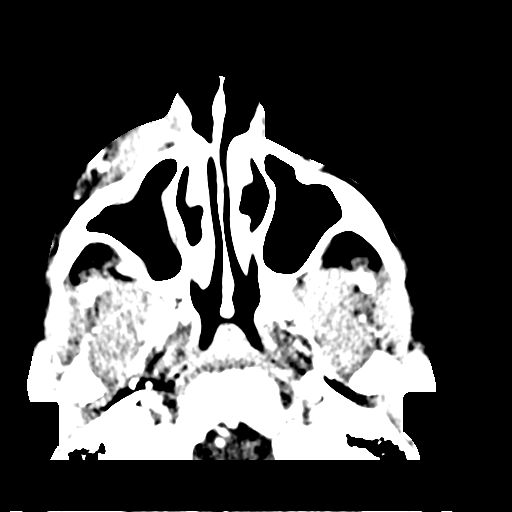
[im 47/86  bone]
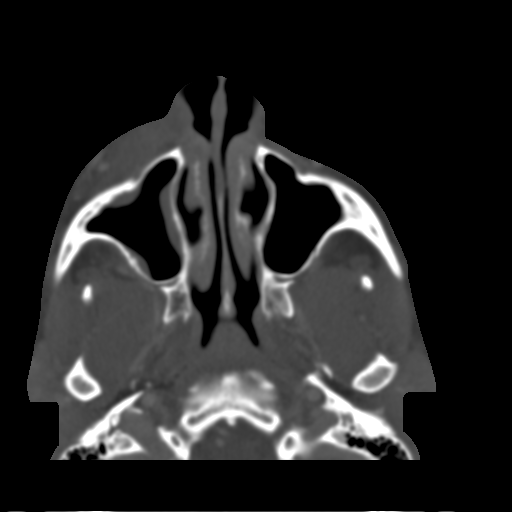
[im 53/86  bone]
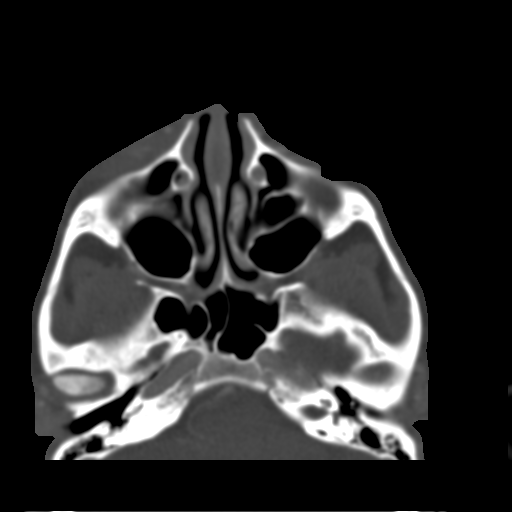
[im 59/86  bone]
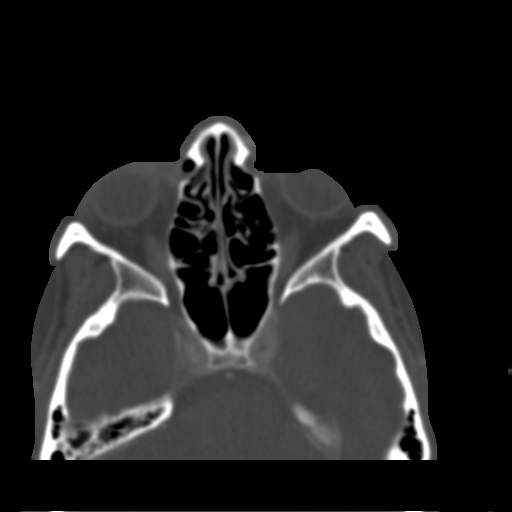
[im 65/86  bone]
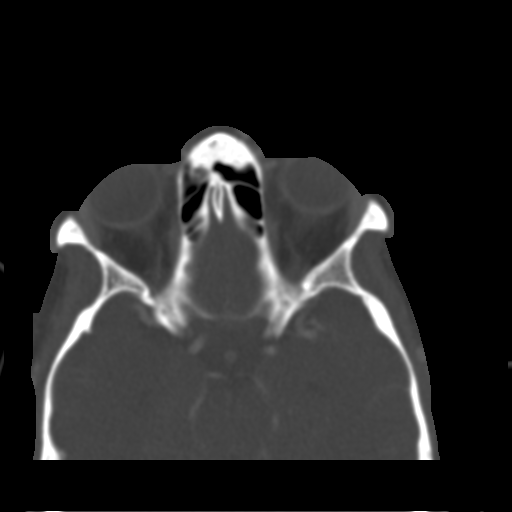
[im 71/86  brain]
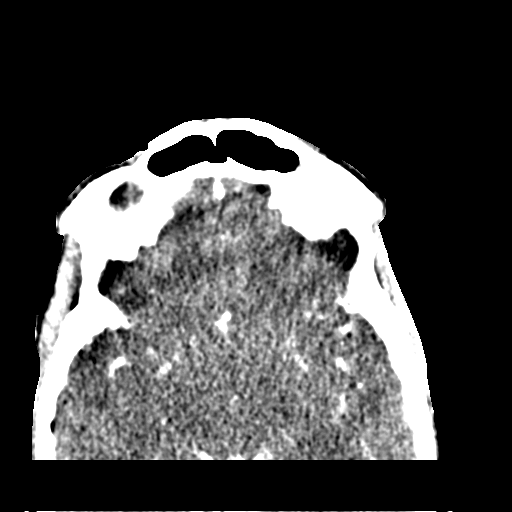
[im 71/86  bone]
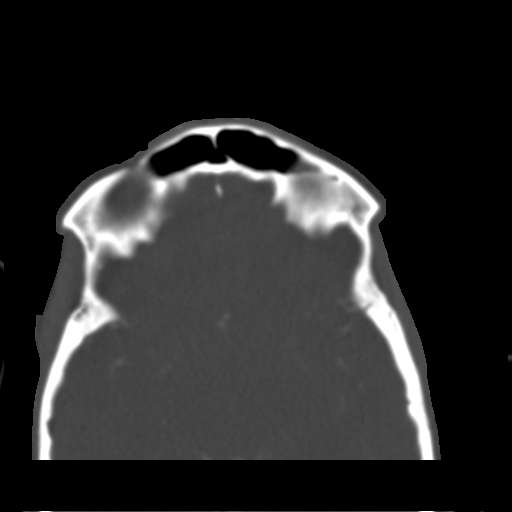
[im 77/86  bone]
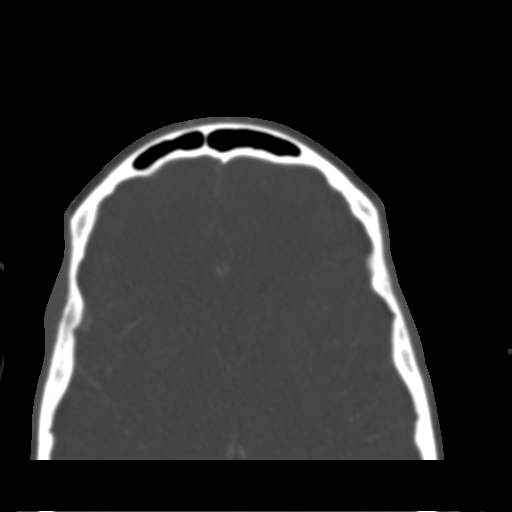
[im 83/86  bone]
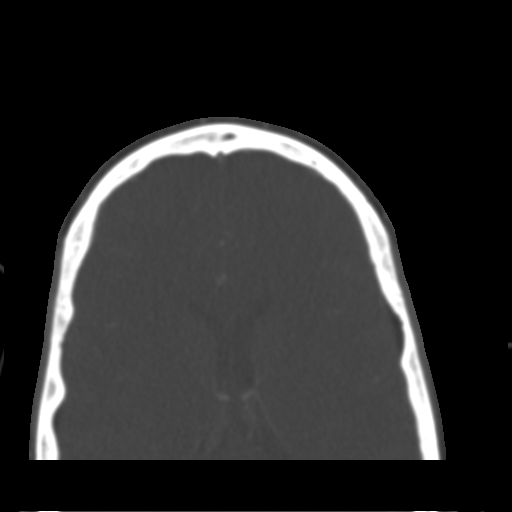

[15 of 30 positions shown; findings below may reference images not displayed]

FINDINGS: There are multiple dental caries and periapical abscesses, which
include teeth #2-6, 14, 15, 19, 30 and 32. There is moderate right
facial soft tissue swelling without discrete soft tissue abscess.
The periapical abscess of tooth #5 appears to have cortical
breakthrough and likely the cause of right facial soft tissue
swelling/ cellulitis. There is no evidence of fracture, subluxation
or dislocation.

No other focal bony abnormalities are noted.

Mild mucosal thickening within the right maxillary sinus is noted.

Orbits and globes are unremarkable. There is no evidence of
postseptal or intraconal abnormality.

The mastoid air cells and inner/ middle ears are clear.
IMPRESSION: Cortical breakthrough of tooth #5 periapical abscess causing right
facial soft tissue swelling/ cellulitis. No discrete soft tissue
abscess.

Multiple dental caries and periapical abscesses as discussed above.

## 2014-05-14 IMAGING — US US RENAL
1 series · 14 of 25 positions shown · non-contrast
Comparison: None.

CLINICAL DATA: Acute renal insufficiency.

EXAM:
RENAL/URINARY TRACT ULTRASOUND COMPLETE

[Series 1: us renal · 0.22mm/px · 14 of 32 slices shown]
[im 1/32]
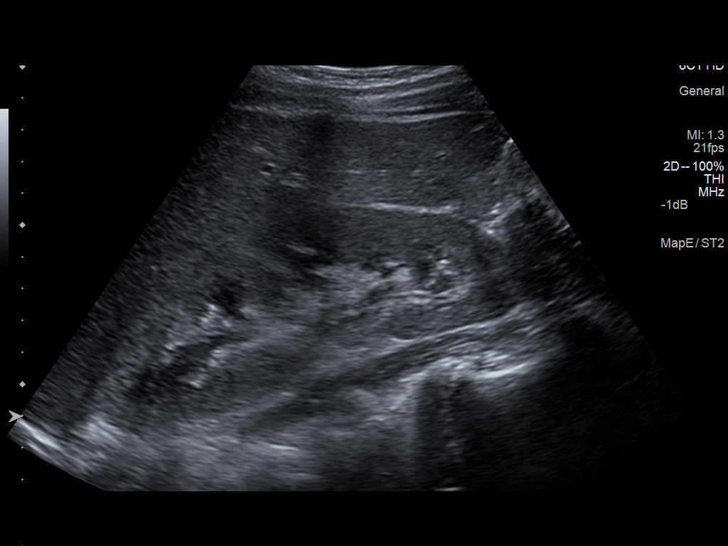
[im 3/32]
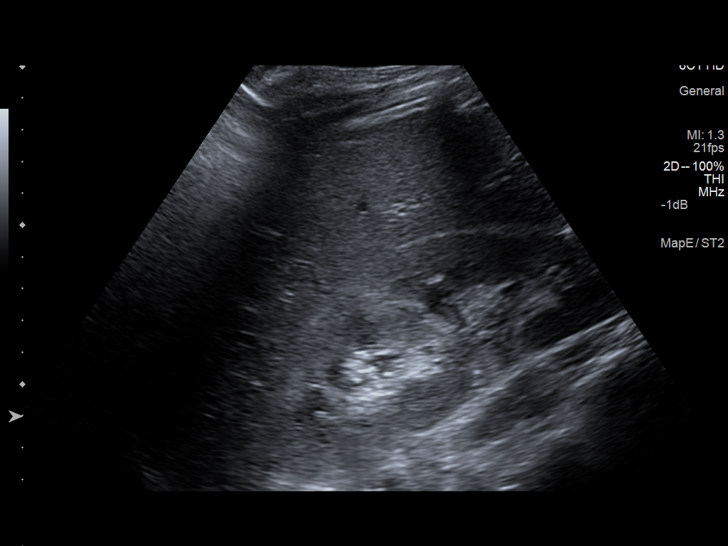
[im 6/32]
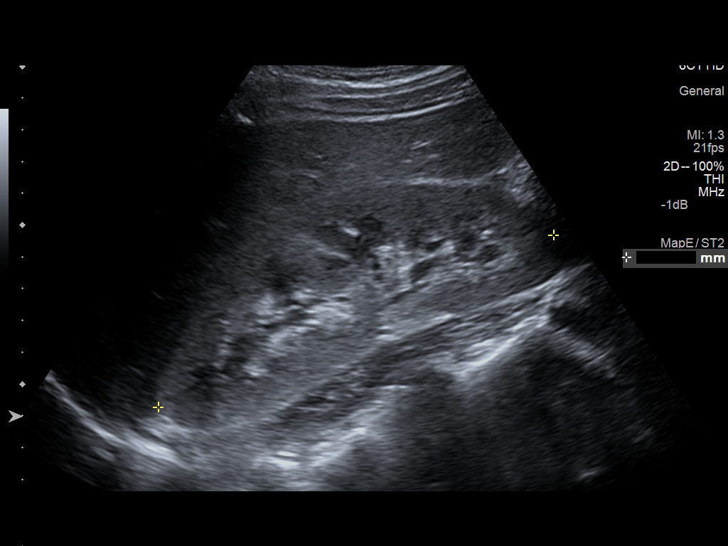
[im 8/32]
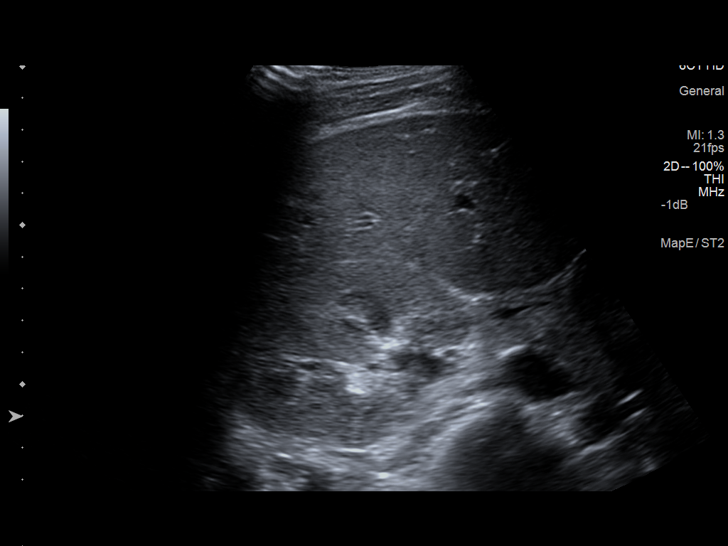
[im 11/32]
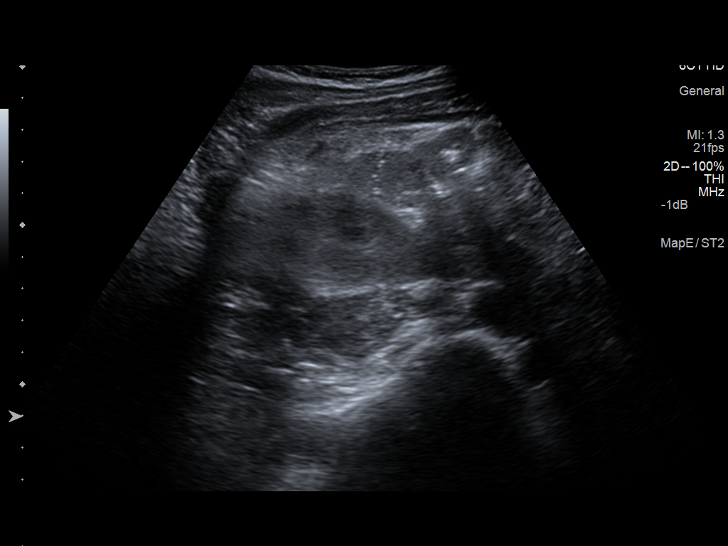
[im 12/32]
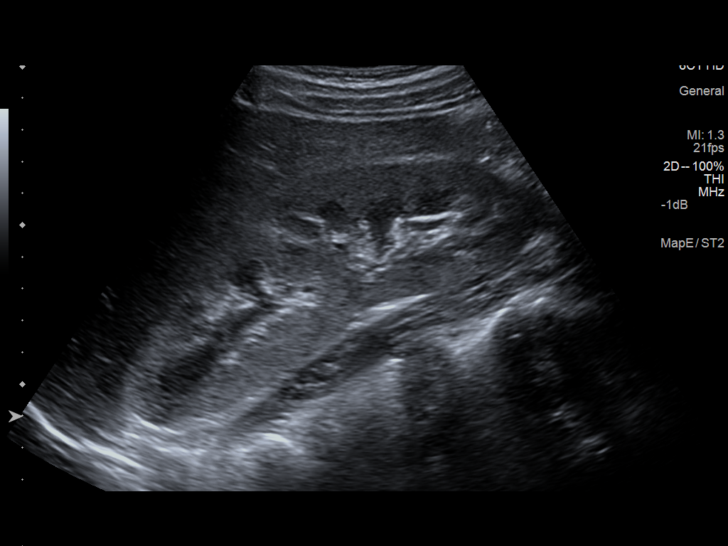
[im 15/32]
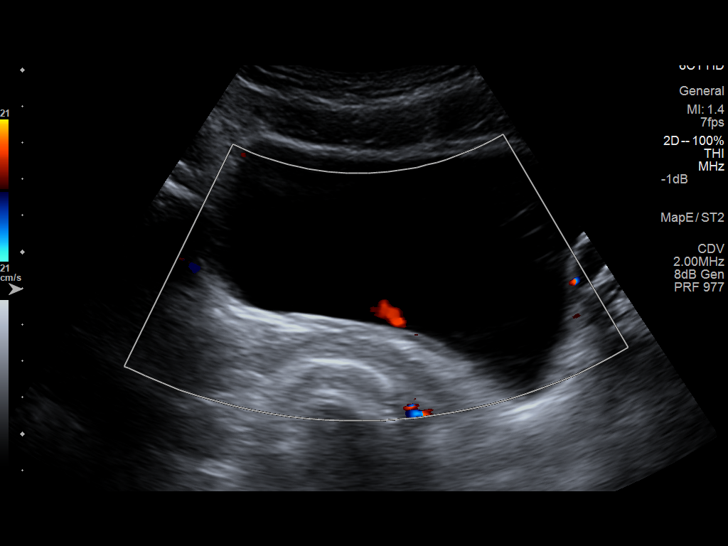
[im 17/32]
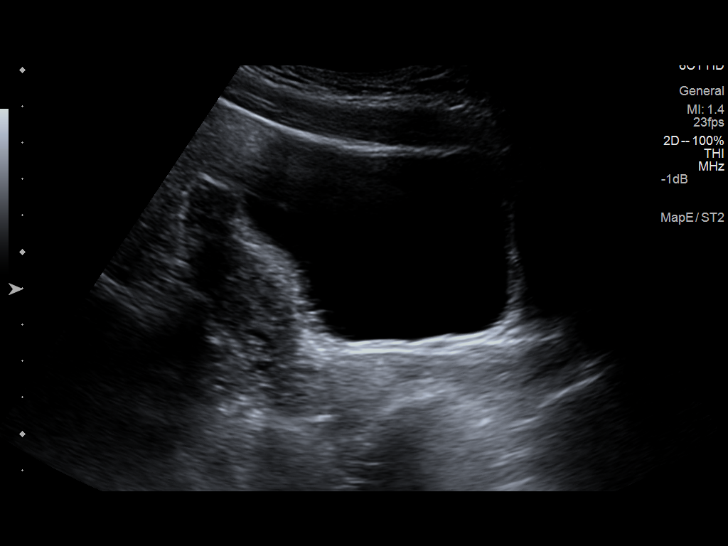
[im 20/32]
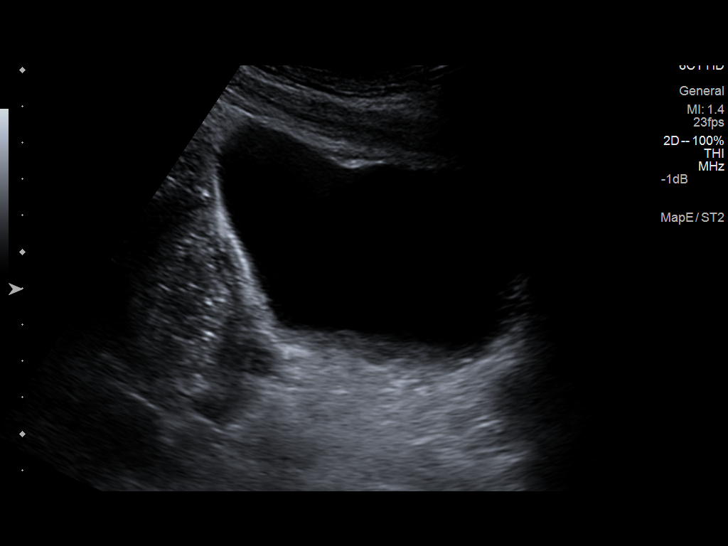
[im 21/32]
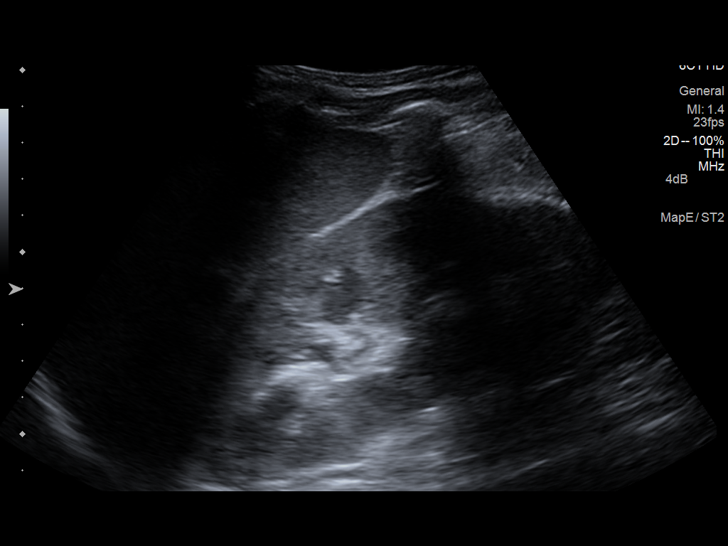
[im 24/32]
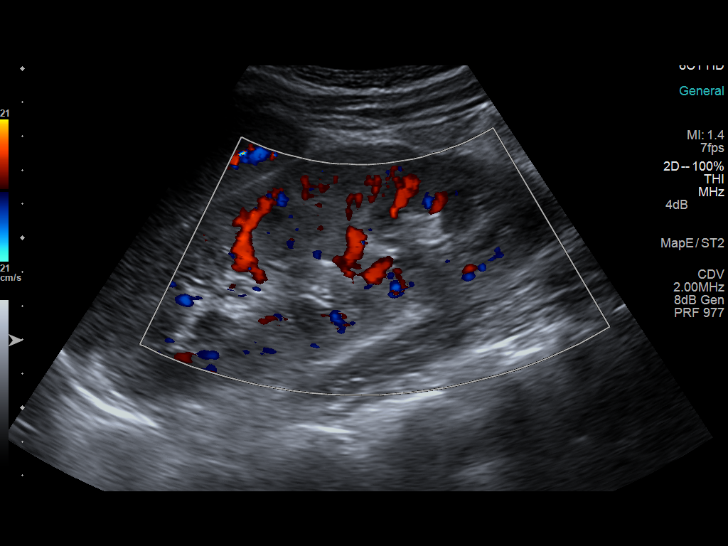
[im 26/32]
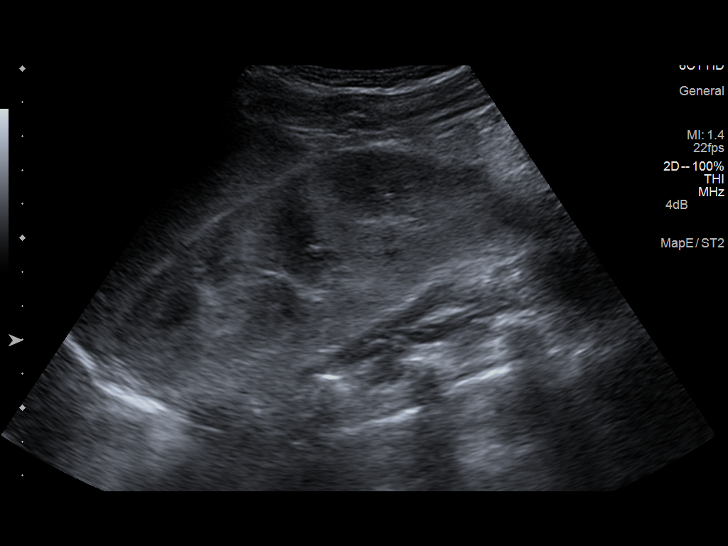
[im 29/32]
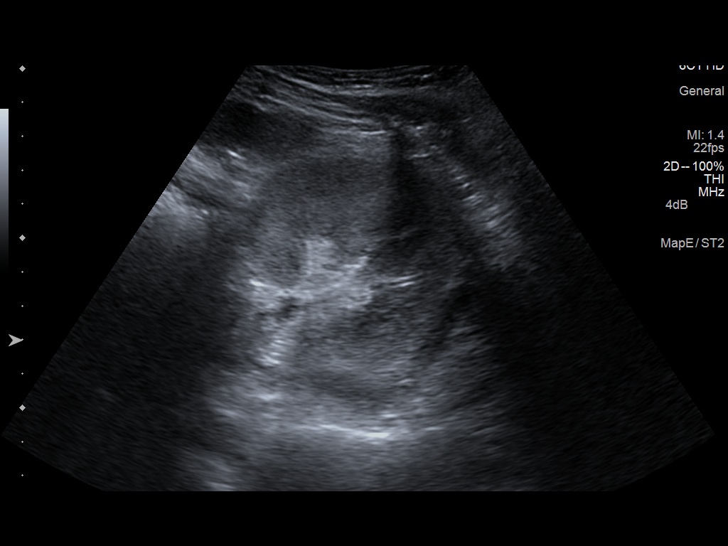
[im 32/32]
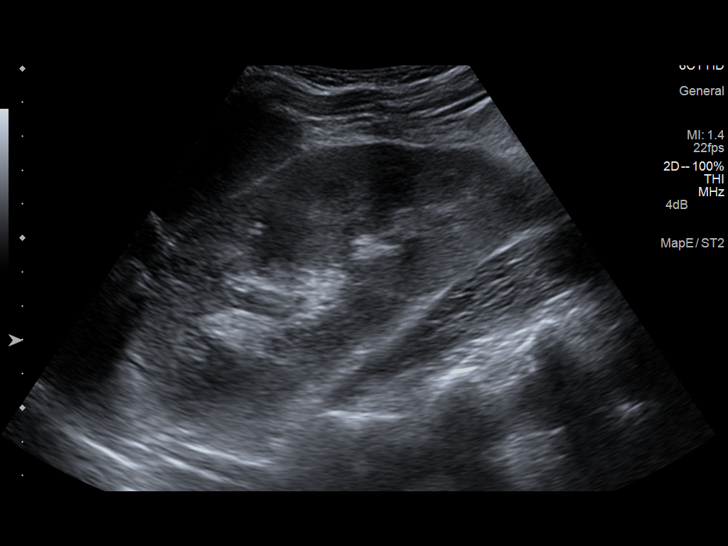

[14 of 25 positions shown; findings below may reference images not displayed]

FINDINGS: Right Kidney:

Length: 13.6 cm. Mildly increased renal parenchymal echogenicity
noted. No mass or hydronephrosis visualized.

Left Kidney:

Length: 14.2 cm. Mildly increased renal parenchymal echogenicity
noted. No mass or hydronephrosis visualized.

Bladder:

Appears normal for degree of bladder distention. Bilateral ureteral
jets are visualized.
IMPRESSION: 1. No evidence of hydronephrosis.  Bilateral ureteral jets are seen.
2. Mildly increased renal parenchymal echogenicity may reflect
medical renal disease.

## 2014-10-06 ENCOUNTER — Encounter (HOSPITAL_COMMUNITY): Payer: Self-pay | Admitting: Emergency Medicine

## 2014-11-07 ENCOUNTER — Encounter (HOSPITAL_COMMUNITY): Payer: Self-pay | Admitting: *Deleted

## 2014-11-07 ENCOUNTER — Inpatient Hospital Stay (HOSPITAL_COMMUNITY)
Admission: AD | Admit: 2014-11-07 | Discharge: 2014-11-07 | Disposition: A | Discharge status: Home / Self Care | Payer: Medicaid Other | Source: Ambulatory Visit | Attending: Obstetrics and Gynecology | Admitting: Obstetrics and Gynecology

## 2014-11-07 DIAGNOSIS — T192XXA Foreign body in vulva and vagina, initial encounter: Secondary | ICD-10-CM | POA: Diagnosis present

## 2014-11-07 DIAGNOSIS — Z711 Person with feared health complaint in whom no diagnosis is made: Secondary | ICD-10-CM | POA: Insufficient documentation

## 2014-11-07 DIAGNOSIS — Z87891 Personal history of nicotine dependence: Secondary | ICD-10-CM | POA: Diagnosis not present

## 2014-11-07 DIAGNOSIS — N189 Chronic kidney disease, unspecified: Secondary | ICD-10-CM | POA: Insufficient documentation

## 2014-11-07 NOTE — MAU Provider Note (Signed)
History     CSN: [id number]  Arrival date and time: 11/07/14 1432   First Provider Initiated Contact with Patient 11/07/14 1615      Chief Complaint  Patient presents with  . Foreign Body in Vagina   HPI Ms. Amanda Beasley is a 23 y.o. G1P1001 who presents to MAU today with complaint of possible tampon stuck in vagina. Patient states that she put in a tampon around 0400 today and was unable to remove it. She denies pain, vaginal discharge, odor, fever or N/V. LMP was 11/02/14.   OB History    Gravida Para Term Preterm AB TAB SAB Ectopic Multiple Living   1 1 1       1       Past Medical History  Diagnosis Date  . No pertinent past medical history   . Chronic kidney disease 12/24/2013    acute renal failure  . Abscess     Past Surgical History  Procedure Laterality Date  . No past surgeries      Family History  Problem Relation Age of Onset  . Diabetes Mother   . Hypertension Mother   . Stroke Mother   . Heart disease Mother   . Diabetes Maternal Grandmother   . Cancer Father     History  Substance Use Topics  . Smoking status: Former Smoker    Quit date: 10/27/2013  . Smokeless tobacco: Never Used  . Alcohol Use: No    Allergies:  Allergies  Allergen Reactions  . Nickel Rash    Prescriptions prior to admission  Medication Sig Dispense Refill Last Dose  . acetaminophen (TYLENOL) 500 MG tablet Take 1 tablet (500 mg total) by mouth every 4 (four) hours as needed for moderate pain or headache. 30 tablet 0 12/20/13  . clindamycin (CLEOCIN) 300 MG capsule Take 1 capsule (300 mg total) by mouth 3 (three) times daily. For 7days 21 capsule 0 12/23/2013 at Unknown time  . promethazine (PHENERGAN) 12.5 MG tablet Take 1 tablet (12.5 mg total) by mouth every 6 (six) hours as needed for nausea or vomiting. 20 tablet 0     Review of Systems  Constitutional: Negative for fever and malaise/fatigue.  Gastrointestinal: Negative for nausea, vomiting and abdominal  pain.  Genitourinary:       Neg - vaginal bleeding, discharge   Physical Exam   Blood pressure 146/83, pulse 76, temperature 98.2 F (36.8 C), resp. rate 18, height 5\' 8"  (1.727 m), weight 154 lb 9.6 oz (70.126 kg), last menstrual period 11/02/2014.  Physical Exam  Constitutional: She is oriented to person, place, and time. She appears well-developed and well-nourished. No distress.  HENT:  Head: Normocephalic.  Cardiovascular: Normal rate.   Respiratory: Effort normal.  GI: Soft. She exhibits no distension. There is no tenderness.  Genitourinary: Cervix exhibits no motion tenderness, no discharge and no friability. No bleeding in the vagina. No foreign body (no tampon found) around the vagina. Vaginal discharge (scant mucous discharge noted) found.  Neurological: She is alert and oriented to person, place, and time.  Skin: Skin is warm and dry. No erythema.  Psychiatric: She has a normal mood and affect.    MAU Course  Procedures None  Assessment and Plan  A: No foreign body in vagina  P: Discharge home Patient advised to follow-up with PCP as needed Patient may return to MAU as needed or if her condition were to change or worsen   Marny LowensteinJulie N Wenzel, PA-C  11/07/2014, 4:15 PM

## 2014-11-07 NOTE — MAU Note (Signed)
Pt stated she put a tampon in at 4am and is unable to retrieve it. Does not think it came out but cannot find it.

## 2014-11-07 NOTE — Discharge Instructions (Signed)
Menstruation °Menstruation is the monthly passing of blood, tissue, fluid and mucus, also know as a period. Your body is shedding the lining of the uterus. The flow, or amount of blood, usually lasts from 3-7 days each month. Hormones control the menstrual cycle. Hormones are a chemical substance produced by endocrine glands in the body to regulate different bodily functions. °The first menstrual period may start any time between age 25 years to 16 years. However, it usually starts around age 12 years. Some girls have regular monthly menstrual cycles right from the beginning. However, it is not unusual to have only a couple of drops of blood or spotting when you first start menstruating. It is also not unusual to have two periods a month or miss a month or two when first starting your periods. °SYMPTOMS  °· Mild to moderate abdominal cramps. °· Aching or pain in the lower back area. °Symptoms may occur 5-10 days before your menstrual period starts. These symptoms are referred to as premenstrual syndrome (PMS). These symptoms can include: °· Headache. °· Breast tenderness and swelling. °· Bloating. °· Tiredness (fatigue). °· Mood changes. °· Craving for certain foods. °These are normal signs and symptoms and can vary in severity. To help relieve these problems, ask your caregiver if you can take over-the-counter medications for pain or discomfort. If the symptoms are not controllable, see your caregiver for help.  °HORMONES INVOLVED IN MENSTRUATION °Menstruation comes about because of hormones produced by the pituitary gland in the brain and the ovaries that affect the uterine lining. °First, the pituitary gland in the brain produces the hormone follicle stimulating hormone (FSH). FSH stimulates the ovaries to produce estrogen, which thickens the uterine lining and begins to develop an egg in the ovary. About 14 days later, the pituitary gland produces another hormone called luteinizing hormone (LH). LH causes the egg  to come out of a sac in the ovary (ovulation). The empty sac on the ovary called the corpus luteum is stimulated by another hormone from the pituitary gland called luteotropin. The corpus luteum begins to produce the estrogen and progesterone hormone. The progesterone hormone prepares the lining of the uterus to have the fertilized egg (egg combined with sperm) attach to the lining of the uterus and begin to develop into a fetus. If the egg is not fertilized, the corpus luteum stops producing estrogen and progesterone, it disappears, the lining of the uterus sloughs off and a menstrual period begins. Then the menstrual cycle starts all over again and will continue monthly unless pregnancy occurs or menopause begins. °The secretion of hormones is complex. Various parts of the body become involved in many chemical activities. Female sex hormones have other functions in a woman's body as well. Estrogen increases a woman's sex drive (libido). It naturally helps body get rid of fluids (diuretic). It also aids in the process of building new bone. Therefore, maintaining hormonal health is essential to all levels of a woman's well being. These hormones are usually present in normal amounts and cause you to menstruate. It is the relationship between the (small) levels of the hormones that is critical. When the balance is upset, menstrual irregularities can occur. °HOW DOES THE MENSTRUAL CYCLE HAPPEN? °· Menstrual cycles vary in length from 21-35 days with an average of 29 days. The cycle begins on the first day of bleeding. At this time, the pituitary gland in the brain releases FSH that travels through the bloodstream to the ovaries. The FSH stimulates the follicles in the   ovaries. This prepares the body for ovulation that occurs around the 14th day of the cycle. The ovaries produce estrogen, and this makes sure conditions are right in the uterus for implantation of the fertilized egg. °· When the levels of estrogen reach a  high enough level, it signals the gland in the brain (pituitary gland) to release a surge of LH. This causes the release of the ripest egg from its follicle (ovulation). Usually only one follicle releases one egg, but sometimes more than one follicle releases an egg especially when stimulating the ovaries for in vitro fertilization. The egg can then be collected by either fallopian tube to await fertilization. The burst follicle within the ovary that is left behind is now called the corpus luteum or "yellow body." The corpus luteum continues to give off (secrete) reduced amounts of estrogen. This closes and hardens the cervix. It dries up the mucus to the naturally infertile condition. °· The corpus luteum also begins to give off greater amounts of progesterone. This causes the lining of the uterus (endometrium) to thicken even more in preparation for the fertilized egg. The egg is starting to journey down from the fallopian tube to the uterus. It also signals the ovaries to stop releasing eggs. It assists in returning the cervical mucus to its infertile state. °· If the egg implants successfully into the womb lining and pregnancy occurs, progesterone levels will continue to raise. It is often this hormone that gives some pregnant women a feeling of well being, like a "natural high." Progesterone levels drop again after childbirth. °· If fertilization does not occur, the corpus luteum dies, stopping the production of hormones. This sudden drop in progesterone causes the uterine lining to break down, accompanied by blood (menstruation). °· This starts the cycle back at day 1. The whole process starts all over again. Woman go through this cycle every month from puberty to menopause. Women have breaks only for pregnancy and breastfeeding (lactation), unless the woman has health problems that affect the female hormone system or chooses to use oral contraceptives to have unnatural menstrual periods. °HOME CARE  INSTRUCTIONS  °· Keep track of your periods by using a calendar. °· If you use tampons, get the least absorbent to avoid toxic shock syndrome. °· Do not leave tampons in the vagina over night or longer than 6 hours. °· Wear a sanitary pad over night. °· Exercise 3-5 times a week or more. °· Avoid foods and drinks that you know will make your symptoms worse before or during your period. °SEEK MEDICAL CARE IF:  °· You develop a fever with your period. °· Your periods are lasting more than 7 days. °· Your period is so heavy that you have to change pads or tampons every 30 minutes. °· You develop clots with your period and never had clots before. °· You cannot get relief from over-the-counter medication for your symptoms. °· Your period has not started, and it has been longer than 35 days. °Document Released: 11/11/2002 Document Revised: 11/26/2013 Document Reviewed: 06/20/2013 °Exit[employer]® Patient Information ©2015 [employer]. This information is not intended to replace advice given to you by your health care provider. Make sure you discuss any questions you have with your health care provider. ° °

## 2015-03-16 ENCOUNTER — Encounter (HOSPITAL_COMMUNITY): Payer: Self-pay | Admitting: *Deleted

## 2015-03-16 ENCOUNTER — Inpatient Hospital Stay (HOSPITAL_COMMUNITY)
Admission: AD | Admit: 2015-03-16 | Discharge: 2015-03-16 | Disposition: A | Discharge status: Home / Self Care | Payer: Medicaid Other | Source: Ambulatory Visit | Attending: Obstetrics and Gynecology | Admitting: Obstetrics and Gynecology

## 2015-03-16 DIAGNOSIS — Z87891 Personal history of nicotine dependence: Secondary | ICD-10-CM | POA: Insufficient documentation

## 2015-03-16 DIAGNOSIS — N921 Excessive and frequent menstruation with irregular cycle: Secondary | ICD-10-CM | POA: Insufficient documentation

## 2015-03-16 DIAGNOSIS — N938 Other specified abnormal uterine and vaginal bleeding: Secondary | ICD-10-CM | POA: Diagnosis not present

## 2015-03-16 DIAGNOSIS — N189 Chronic kidney disease, unspecified: Secondary | ICD-10-CM | POA: Diagnosis not present

## 2015-03-16 DIAGNOSIS — N923 Ovulation bleeding: Secondary | ICD-10-CM

## 2015-03-16 LAB — CBC WITH DIFFERENTIAL/PLATELET
BASOS ABS: 0.1 10*3/uL (ref 0.0–0.1)
BASOS PCT: 1 % (ref 0–1)
EOS ABS: 0.3 10*3/uL (ref 0.0–0.7)
EOS PCT: 4 % (ref 0–5)
HCT: 36.6 % (ref 36.0–46.0)
Hemoglobin: 12.4 g/dL (ref 12.0–15.0)
Lymphocytes Relative: 43 % (ref 12–46)
Lymphs Abs: 2.8 10*3/uL (ref 0.7–4.0)
MCH: 27 pg (ref 26.0–34.0)
MCHC: 33.9 g/dL (ref 30.0–36.0)
MCV: 79.7 fL (ref 78.0–100.0)
Monocytes Absolute: 0.5 10*3/uL (ref 0.1–1.0)
Monocytes Relative: 7 % (ref 3–12)
Neutro Abs: 2.9 10*3/uL (ref 1.7–7.7)
Neutrophils Relative %: 45 % (ref 43–77)
PLATELETS: 297 10*3/uL (ref 150–400)
RBC: 4.59 MIL/uL (ref 3.87–5.11)
RDW: 13.6 % (ref 11.5–15.5)
WBC: 6.4 10*3/uL (ref 4.0–10.5)

## 2015-03-16 LAB — POCT PREGNANCY, URINE: Preg Test, Ur: NEGATIVE

## 2015-03-16 LAB — URINALYSIS, ROUTINE W REFLEX MICROSCOPIC
BILIRUBIN URINE: NEGATIVE
Glucose, UA: NEGATIVE mg/dL
Ketones, ur: NEGATIVE mg/dL
Leukocytes, UA: NEGATIVE
Nitrite: NEGATIVE
PH: 6 (ref 5.0–8.0)
Protein, ur: NEGATIVE mg/dL
Specific Gravity, Urine: >1.03 — ABNORMAL HIGH (ref 1.005–1.030)
UROBILINOGEN UA: 0.2 mg/dL (ref 0.0–1.0)

## 2015-03-16 LAB — URINE MICROSCOPIC-ADD ON

## 2015-03-16 NOTE — MAU Provider Note (Signed)
  History  24 yo female in with c/o periods as usual for past 3 mos but several days afterwards she will start spotting and spot for 2-3 days. This has happened for past 3 months. Denies pregnancy, states always makes her partner wear condom. Condom as contraceptive method.   CSN: [id number]  Arrival date and time: 03/16/15 1335   First Provider Initiated Contact with Patient 03/16/15 1608      Chief Complaint  Patient presents with  . Metrorrhagia   HPI  OB History    Gravida Para Term Preterm AB TAB SAB Ectopic Multiple Living   1 1 1       1       Past Medical History  Diagnosis Date  . No pertinent past medical history   . Chronic kidney disease 12/24/2013    acute renal failure  . Abscess     Past Surgical History  Procedure Laterality Date  . No past surgeries      Family History  Problem Relation Age of Onset  . Diabetes Mother   . Hypertension Mother   . Stroke Mother   . Heart disease Mother   . Diabetes Maternal Grandmother   . Cancer Father     History  Substance Use Topics  . Smoking status: Former Smoker    Quit date: 10/27/2013  . Smokeless tobacco: Never Used  . Alcohol Use: Yes    Allergies:  Allergies  Allergen Reactions  . Clindamycin/Lincomycin Nausea Only  . Nickel Rash    No prescriptions prior to admission    Review of Systems  Constitutional: Negative.   HENT: Negative.   Eyes: Negative.   Respiratory: Negative.   Cardiovascular: Negative.   Gastrointestinal: Negative.   Genitourinary: Negative.   Musculoskeletal: Negative.   Skin: Negative.   Neurological: Negative.   Endo/Heme/Allergies: Negative.   Psychiatric/Behavioral: Negative.    Physical Exam   Blood pressure 138/76, pulse 94, temperature 98.2 F (36.8 C), temperature source Oral, resp. rate 18, height 5\' 8"  (1.727 m), weight 160 lb 9.6 oz (72.848 kg), last menstrual period 03/03/2015.  Physical Exam  Constitutional: She is oriented to person, place, and  time. She appears well-developed and well-nourished.  HENT:  Head: Normocephalic.  Neck: Normal range of motion.  Cardiovascular: Normal rate, regular rhythm, normal heart sounds and intact distal pulses.   Respiratory: Effort normal and breath sounds normal.  GI: Soft. Bowel sounds are normal.  Genitourinary: Vagina normal and uterus normal.  Musculoskeletal: Normal range of motion.  Neurological: She is alert and oriented to person, place, and time. She has normal reflexes.  Skin: Skin is warm and dry.  Psychiatric: She has a normal mood and affect. Her behavior is normal. Judgment and thought content normal.    MAU Course  Procedures  MDM Intermenstrual bleeding  Assessment and Plan  Gc,chla, cbc. Discuss tx options with pt she states she does not want to take oc's and the bleeding is bothersome but not painful. CBC stable will d/c home  Maliik Karner DARLENE 03/16/2015, 4:09 PM

## 2015-03-16 NOTE — MAU Note (Signed)
LMP was 3/29 - 4/4.  Is now bleeding again, started two days later.  Still spotting now.  Denies pain but feels heaviness in her back.

## 2015-03-17 LAB — GC/CHLAMYDIA PROBE AMP (~~LOC~~) NOT AT ARMC
Chlamydia: NEGATIVE
NEISSERIA GONORRHEA: NEGATIVE

## 2015-03-17 LAB — RPR: RPR Ser Ql: NONREACTIVE

## 2015-03-17 LAB — HIV ANTIBODY (ROUTINE TESTING W REFLEX): HIV SCREEN 4TH GENERATION: NONREACTIVE

## 2015-03-19 ENCOUNTER — Encounter (HOSPITAL_COMMUNITY): Payer: Self-pay | Admitting: Emergency Medicine

## 2015-03-19 ENCOUNTER — Emergency Department (HOSPITAL_COMMUNITY)
Admission: EM | Admit: 2015-03-19 | Discharge: 2015-03-19 | Disposition: A | Discharge status: Home / Self Care | Payer: Medicaid Other | Attending: Emergency Medicine | Admitting: Emergency Medicine

## 2015-03-19 DIAGNOSIS — Z87891 Personal history of nicotine dependence: Secondary | ICD-10-CM | POA: Diagnosis not present

## 2015-03-19 DIAGNOSIS — N939 Abnormal uterine and vaginal bleeding, unspecified: Secondary | ICD-10-CM | POA: Diagnosis present

## 2015-03-19 DIAGNOSIS — Z872 Personal history of diseases of the skin and subcutaneous tissue: Secondary | ICD-10-CM | POA: Diagnosis not present

## 2015-03-19 DIAGNOSIS — N938 Other specified abnormal uterine and vaginal bleeding: Secondary | ICD-10-CM | POA: Insufficient documentation

## 2015-03-19 DIAGNOSIS — N189 Chronic kidney disease, unspecified: Secondary | ICD-10-CM | POA: Insufficient documentation

## 2015-03-19 DIAGNOSIS — Z3202 Encounter for pregnancy test, result negative: Secondary | ICD-10-CM | POA: Diagnosis not present

## 2015-03-19 LAB — URINE MICROSCOPIC-ADD ON

## 2015-03-19 LAB — URINALYSIS, ROUTINE W REFLEX MICROSCOPIC
Bilirubin Urine: NEGATIVE
Glucose, UA: NEGATIVE mg/dL
Ketones, ur: 15 mg/dL — AB
Nitrite: NEGATIVE
PROTEIN: 30 mg/dL — AB
Specific Gravity, Urine: 1.029 (ref 1.005–1.030)
UROBILINOGEN UA: 0.2 mg/dL (ref 0.0–1.0)
pH: 5.5 (ref 5.0–8.0)

## 2015-03-19 LAB — POC URINE PREG, ED: Preg Test, Ur: NEGATIVE

## 2015-03-19 NOTE — ED Provider Notes (Signed)
CSN: [id number]     Arrival date & time 03/19/15  1219 History   First MD Initiated Contact with Patient 03/19/15 1317     Chief Complaint  Patient presents with  . Vaginal Bleeding     (Consider location/radiation/quality/duration/timing/severity/associated sxs/prior Treatment) Patient is a 24 y.o. female presenting with vaginal bleeding.  Vaginal Bleeding Quality:  Clots and dark red Severity:  Moderate Onset quality:  Gradual Duration:  10 days Timing:  Constant Progression:  Worsening (worse today) Chronicity:  New Menstrual history:  Regular Possible pregnancy: no   Context: spontaneously   Context comment:  Spotting continued after last menstrual period.  [facility] monday and had negative workup.   Relieved by:  Nothing Worsened by:  Nothing tried Associated symptoms: back pain   Associated symptoms: no abdominal pain, no dizziness, no dysuria, no fever and no vaginal discharge     Past Medical History  Diagnosis Date  . No pertinent past medical history   . Chronic kidney disease 12/24/2013    acute renal failure  . Abscess    Past Surgical History  Procedure Laterality Date  . No past surgeries     Family History  Problem Relation Age of Onset  . Diabetes Mother   . Hypertension Mother   . Stroke Mother   . Heart disease Mother   . Diabetes Maternal Grandmother   . Cancer Father    History  Substance Use Topics  . Smoking status: Former Smoker    Quit date: 10/27/2013  . Smokeless tobacco: Never Used  . Alcohol Use: Yes   OB History    Gravida Para Term Preterm AB TAB SAB Ectopic Multiple Living   1 1 1       1      Review of Systems  Constitutional: Negative for fever.  Gastrointestinal: Negative for abdominal pain.  Genitourinary: Positive for vaginal bleeding. Negative for dysuria and vaginal discharge.  Musculoskeletal: Positive for back pain.  Neurological: Negative for dizziness.  All other systems reviewed and are  negative.     Allergies  Clindamycin/lincomycin and Nickel  Home Medications   Prior to Admission medications   Not on File   BP 157/98 mmHg  Pulse 100  Temp(Src) 98.1 F (36.7 C) (Oral)  Resp 16  SpO2 100%  LMP 03/03/2015 (Exact Date) Physical Exam  Constitutional: She is oriented to person, place, and time. She appears well-developed and well-nourished. No distress.  HENT:  Head: Normocephalic and atraumatic.  Eyes: Conjunctivae are normal. No scleral icterus.  Neck: Neck supple.  Cardiovascular: Normal rate and intact distal pulses.   Pulmonary/Chest: Effort normal. No stridor. No respiratory distress.  Abdominal: Soft. Normal appearance. She exhibits no distension. There is no tenderness. There is no rebound and no guarding.  Neurological: She is alert and oriented to person, place, and time.  Skin: Skin is warm and dry. No rash noted.  Psychiatric: She has a normal mood and affect. Her behavior is normal.  Nursing note and vitals reviewed.   ED Course  Procedures (including critical care time) Labs Review Labs Reviewed  URINALYSIS, ROUTINE W REFLEX MICROSCOPIC - Abnormal; Notable for the following:    Color, Urine RED (*)    APPearance CLOUDY (*)    Hgb urine dipstick LARGE (*)    Ketones, ur 15 (*)    Protein, ur 30 (*)    Leukocytes, UA SMALL (*)    All other components within normal limits  URINE MICROSCOPIC-ADD ON  POC URINE PREG, ED    Imaging Review No results found.   EKG Interpretation None      MDM   Final diagnoses:  Dysfunctional uterine bleeding    24 yo female with vaginal bleeding.  Seen by [facility] a few days ago and reports a negative workup.  Bleeding increased today, so she came to the ED.  Well appearing, nontoxic, no symptoms to suggest severe bloodloss.  She declined repeat pelvic exam.  I don't think she needs bloodwork.  UA and U preg negative.     Blake Divine, MD 03/19/15 [phone]

## 2015-03-19 NOTE — Discharge Instructions (Signed)
Abnormal Uterine Bleeding Abnormal uterine bleeding can affect women at various stages in life, including teenagers, women in their reproductive years, pregnant women, and women who have reached menopause. Several kinds of uterine bleeding are considered abnormal, including:  Bleeding or spotting between periods.   Bleeding after sexual intercourse.   Bleeding that is heavier or more than normal.   Periods that last longer than usual.  Bleeding after menopause.  Many cases of abnormal uterine bleeding are minor and simple to treat, while others are more serious. Any type of abnormal bleeding should be evaluated by your health care provider. Treatment will depend on the cause of the bleeding. HOME CARE INSTRUCTIONS Monitor your condition for any changes. The following actions may help to alleviate any discomfort you are experiencing:  Avoid the use of tampons and douches as directed by your health care provider.  Change your pads frequently. You should get regular pelvic exams and Pap tests. Keep all follow-up appointments for diagnostic tests as directed by your health care provider.  SEEK MEDICAL CARE IF:   Your bleeding lasts more than 1 week.   You feel dizzy at times.  SEEK IMMEDIATE MEDICAL CARE IF:   You pass out.   You are changing pads every 15 to 30 minutes.   You have abdominal pain.  You have a fever.   You become sweaty or weak.   You are passing large blood clots from the vagina.   You start to feel nauseous and vomit. MAKE SURE YOU:   Understand these instructions.  Will watch your condition.  Will get help right away if you are not doing well or get worse. Document Released: 11/21/2005 Document Revised: 11/26/2013 Document Reviewed: 06/20/2013 Exit[employer] Patient Information 2015 [employer]. This information is not intended to replace advice given to you by your health care provider. Make sure you discuss any questions you have with your  health care provider.  

## 2015-03-19 NOTE — ED Notes (Signed)
Pt c/o of vaginal bleeding since 4/4. Pt sts she was on her period the last week of march but then continued to spot and bleed. [facility] and was tested for STD's and pregnancy. Pt sts "Every test came back negative." Pt denies cramping but sts her lower back hurts. Pt sts she is passing clots. Pt was wearing panty liners but bleeding through them. Pt sts "I wasn't prepared to bleed like this so all I'm using now is tissue paper." Pt is very teaerful and anxious in triage. A&Ox4 and ambulatory.

## 2015-04-15 ENCOUNTER — Ambulatory Visit: Payer: Medicaid Other | Admitting: Certified Nurse Midwife

## 2015-05-14 ENCOUNTER — Ambulatory Visit: Payer: Medicaid Other | Admitting: Certified Nurse Midwife

## 2015-10-28 ENCOUNTER — Emergency Department (INDEPENDENT_AMBULATORY_CARE_PROVIDER_SITE_OTHER)
Admission: EM | Admit: 2015-10-28 | Discharge: 2015-10-28 | Disposition: A | Discharge status: Nursing Home (Includes ICF) | Payer: Self-pay | Source: Home / Self Care | Attending: Family Medicine | Admitting: Family Medicine

## 2015-10-28 ENCOUNTER — Encounter (HOSPITAL_COMMUNITY): Payer: Self-pay | Admitting: Emergency Medicine

## 2015-10-28 ENCOUNTER — Emergency Department (HOSPITAL_COMMUNITY)
Admission: EM | Admit: 2015-10-28 | Discharge: 2015-10-28 | Disposition: A | Discharge status: Home / Self Care | Payer: Self-pay | Attending: Emergency Medicine | Admitting: Emergency Medicine

## 2015-10-28 DIAGNOSIS — K047 Periapical abscess without sinus: Secondary | ICD-10-CM

## 2015-10-28 DIAGNOSIS — N189 Chronic kidney disease, unspecified: Secondary | ICD-10-CM | POA: Insufficient documentation

## 2015-10-28 DIAGNOSIS — Z87891 Personal history of nicotine dependence: Secondary | ICD-10-CM | POA: Insufficient documentation

## 2015-10-28 MED ORDER — AMOXICILLIN-POT CLAVULANATE 875-125 MG PO TABS: 1.0000 | ORAL_TABLET | Freq: Two times a day (BID) | ORAL | Status: DC

## 2015-10-28 MED ORDER — NAPROXEN 500 MG PO TABS: 500.0000 mg | ORAL_TABLET | Freq: Two times a day (BID) | ORAL | Status: DC

## 2015-10-28 MED ORDER — BUPIVACAINE-EPINEPHRINE (PF) 0.25% -1:200000 IJ SOLN: 5.0000 mL | Freq: Once | INTRAMUSCULAR | Status: DC

## 2015-10-28 MED ORDER — IBUPROFEN 800 MG PO TABS
800.0000 mg | ORAL_TABLET | Freq: Once | ORAL | Status: AC
  Administered 2015-10-28: 800 mg via ORAL
  Filled 2015-10-28: qty 1

## 2015-10-28 MED ORDER — LIDOCAINE HCL (PF) 1 % IJ SOLN
5.0000 mL | Freq: Once | INTRAMUSCULAR | Status: DC
  Filled 2015-10-28: qty 5

## 2015-10-28 NOTE — Discharge Instructions (Signed)

## 2015-10-28 NOTE — ED Provider Notes (Signed)
CSN: [id number]     Arrival date & time 10/28/15  1701 History   First MD Initiated Contact with Patient 10/28/15 1821     Chief Complaint  Patient presents with  . Dental Pain   (Consider location/radiation/quality/duration/timing/severity/associated sxs/prior Treatment) Patient is a 24 y.o. female presenting with tooth pain. The history is provided by the patient.  Dental Pain Location:  Upper Upper teeth location:  15/LU 2nd molar and 14/LU 1st molar Quality:  Throbbing Severity:  Moderate Onset quality:  Sudden Duration:  1 day Progression:  Worsening Chronicity:  New Context: abscess, dental caries and poor dentition   Relieved by:  None tried Worsened by:  Nothing tried Ineffective treatments:  None tried Associated symptoms: facial pain, facial swelling, fever and gum swelling     Past Medical History  Diagnosis Date  . No pertinent past medical history   . Chronic kidney disease 12/24/2013    acute renal failure  . Abscess    Past Surgical History  Procedure Laterality Date  . No past surgeries     Family History  Problem Relation Age of Onset  . Diabetes Mother   . Hypertension Mother   . Stroke Mother   . Heart disease Mother   . Diabetes Maternal Grandmother   . Cancer Father    Social History  Substance Use Topics  . Smoking status: Former Smoker    Quit date: 10/27/2013  . Smokeless tobacco: Never Used  . Alcohol Use: Yes   OB History    Gravida Para Term Preterm AB TAB SAB Ectopic Multiple Living   Review of Systems  Constitutional: Positive for fever.  HENT: Positive for dental problem and facial swelling.   All other systems reviewed and are negative.   Allergies  Clindamycin/lincomycin and Nickel  Home Medications   Prior to Admission medications   Medication Sig Start Date End Date Taking? Authorizing Provider  acetaminophen (TYLENOL) 325 MG tablet Take 650 mg by mouth every 6 (six) hours as needed.   Yes  Historical Provider, MD   Meds Ordered and Administered this Visit  Medications - No data to display  BP 149/98 mmHg  Pulse 60  Temp(Src) 98 F (36.7 C) (Oral)  Resp 18  SpO2 98%  LMP 10/19/2015 No data found.   Physical Exam  Constitutional: She is oriented to person, place, and time. She appears well-developed and well-nourished. She appears distressed.  HENT:  Right Ear: External ear normal.  Left Ear: External ear normal.  Mouth/Throat: Uvula is midline, oropharynx is clear and moist and mucous membranes are normal. Abnormal dentition. Dental abscesses present.  Eyes: Pupils are equal, round, and reactive to light.  Neck: Normal range of motion. Neck supple.  Lymphadenopathy:    She has no cervical adenopathy.  Neurological: She is alert and oriented to person, place, and time.  Skin: Skin is warm and dry.  Nursing note and vitals reviewed.   ED Course  Procedures (including critical care time)  Labs Review Labs Reviewed - No data to display  Imaging Review No results found.   Visual Acuity Review  Right Eye Distance:   Left Eye Distance:   Bilateral Distance:    Right Eye Near:   Left Eye Near:    Bilateral Near:         MDM   1. Abscess, dental    Sent for dental eval and drainage of abscess.  Linna HoffJames D Kindl, MD 10/28/15 [phone]

## 2015-10-28 NOTE — ED Provider Notes (Signed)
CSN: [id number]     Arrival date & time 10/28/15  1916 History   First MD Initiated Contact with Patient 10/28/15 1957     Chief Complaint  Patient presents with  . Dental Pain     (Consider location/radiation/quality/duration/timing/severity/associated sxs/prior Treatment) Patient is a 24 y.o. female presenting with tooth pain. The history is provided by the patient.  Dental Pain Location:  Upper Upper teeth location:  15/LU 2nd molar Quality:  Dull Severity:  Severe Onset quality:  Gradual Duration:  1 day Timing:  Constant Chronicity:  New Context: abscess   Worsened by:  Touching Ineffective treatments:  Acetaminophen and NSAIDs Associated symptoms: facial pain and facial swelling   Associated symptoms: no fever, no headaches and no neck pain     Past Medical History  Diagnosis Date  . No pertinent past medical history   . Chronic kidney disease 12/24/2013    acute renal failure  . Abscess    Past Surgical History  Procedure Laterality Date  . No past surgeries     Family History  Problem Relation Age of Onset  . Diabetes Mother   . Hypertension Mother   . Stroke Mother   . Heart disease Mother   . Diabetes Maternal Grandmother   . Cancer Father    Social History  Substance Use Topics  . Smoking status: Former Smoker    Quit date: 10/27/2013  . Smokeless tobacco: Never Used  . Alcohol Use: Yes   OB History    Gravida Para Term Preterm AB TAB SAB Ectopic Multiple Living   1 1 1       1      Review of Systems  Constitutional: Negative for fever.  HENT: Positive for dental problem and facial swelling. Negative for rhinorrhea and sore throat.   Eyes: Negative for visual disturbance.  Respiratory: Negative for chest tightness and shortness of breath.   Cardiovascular: Negative for chest pain and palpitations.  Gastrointestinal: Negative for nausea, vomiting, abdominal pain and constipation.  Genitourinary: Negative for dysuria and hematuria.    Musculoskeletal: Negative for back pain and neck pain.  Skin: Negative for rash.  Neurological: Negative for dizziness and headaches.  Psychiatric/Behavioral: Negative for confusion.  All other systems reviewed and are negative.     Allergies  Clindamycin/lincomycin and Nickel  Home Medications   Prior to Admission medications   Medication Sig Start Date End Date Taking? Authorizing Provider  acetaminophen (TYLENOL) 325 MG tablet Take 650 mg by mouth every 6 (six) hours as needed for mild pain.    Yes Historical Provider, MD  ibuprofen (ADVIL,MOTRIN) 200 MG tablet Take 800 mg by mouth every 6 (six) hours as needed for moderate pain (toothpain).   Yes Historical Provider, MD   BP 144/95 mmHg  Pulse 96  Temp(Src) 98.3 F (36.8 C) (Oral)  Resp 18  Ht 5\' 8"  (1.727 m)  Wt 70.308 kg  BMI 23.57 kg/m2  SpO2 100%  LMP 10/19/2015  Physical Exam  Constitutional: She is oriented to person, place, and time. She appears well-developed and well-nourished. No distress.  HENT:  Head: Normocephalic and atraumatic.  Mouth/Throat: Oropharynx is clear and moist. Dental abscesses (left upper molar) present.    Left maxillary fullness, TTP over left maxillary soft tissue.   Eyes: EOM are normal. Pupils are equal, round, and reactive to light.  Neck: Neck supple. No JVD present.  Cardiovascular: Normal rate, regular rhythm, normal heart sounds and intact distal pulses.  Exam reveals no gallop.  No murmur heard. Pulmonary/Chest: Effort normal and breath sounds normal. She has no wheezes. She has no rales.  Abdominal: Soft. She exhibits no distension. There is no tenderness.  Musculoskeletal: Normal range of motion. She exhibits no tenderness.  Neurological: She is alert and oriented to person, place, and time. No cranial nerve deficit. She exhibits normal muscle tone.  Skin: Skin is warm and dry. No rash noted.  Psychiatric: Her behavior is normal.    ED Course  .Marland KitchenIncision and  Drainage Date/Time: 10/28/2015 8:11 PM Performed by: Maris Berger Authorized by: Maris Berger Consent: Verbal consent obtained. Consent given by: patient Type: abscess Body area: mouth Location details: alveolar process Anesthesia: local infiltration Local anesthetic: bupivacaine 0.5% with epinephrine and lidocaine 1% without epinephrine Scalpel size: 11 Needle gauge: 22 Incision type: single straight Complexity: simple Drainage characteristics: seropurulent. Wound treatment: wound left open Patient tolerance: Patient tolerated the procedure well with no immediate complications   (including critica  MDM   Final diagnoses:  Dental abscess    Patient is a 73 showed African American female with a history of chronic disease who presents with dental pain. Patient states that today she woke up with the left side of her face swollen with severe upper molar pain. On exam she does have asymmetric left-sided facial swelling with tenderness to palpation over the left maxilla. She has a carious left upper molar that is tender to percussion. She also has an area of fluctuance over the alveolar ridge concerning for abscess. I&d performed with seropurulent discharge. Will place her on Augmentin.  Discussed with Dr. Radford Pax.  Maris Berger, MD 10/28/15 2034  Nelva Nay, MD 10/28/15 [phone]

## 2015-10-28 NOTE — ED Notes (Signed)
[facility] dental abscess. Pt denies fevers or drainage.

## 2015-10-28 NOTE — ED Notes (Signed)
Left, upper tooth pain, onset yesterday, swelling today

## 2016-04-10 ENCOUNTER — Ambulatory Visit (HOSPITAL_COMMUNITY)
Admission: EM | Admit: 2016-04-10 | Discharge: 2016-04-10 | Disposition: A | Discharge status: Home / Self Care | Payer: Self-pay | Attending: Family Medicine | Admitting: Family Medicine

## 2016-04-10 ENCOUNTER — Encounter (HOSPITAL_COMMUNITY): Payer: Self-pay | Admitting: Emergency Medicine

## 2016-04-10 DIAGNOSIS — X58XXXA Exposure to other specified factors, initial encounter: Secondary | ICD-10-CM | POA: Insufficient documentation

## 2016-04-10 DIAGNOSIS — J302 Other seasonal allergic rhinitis: Secondary | ICD-10-CM

## 2016-04-10 DIAGNOSIS — J3089 Other allergic rhinitis: Secondary | ICD-10-CM | POA: Insufficient documentation

## 2016-04-10 DIAGNOSIS — T700XXA Otitic barotrauma, initial encounter: Secondary | ICD-10-CM | POA: Insufficient documentation

## 2016-04-10 DIAGNOSIS — Z881 Allergy status to other antibiotic agents status: Secondary | ICD-10-CM | POA: Insufficient documentation

## 2016-04-10 LAB — POCT RAPID STREP A: Streptococcus, Group A Screen (Direct): NEGATIVE

## 2016-04-10 NOTE — ED Provider Notes (Signed)
CSN: [id number]     Arrival date & time 04/10/16  1505 History   First MD Initiated Contact with Patient 04/10/16 1654     Chief Complaint  Patient presents with  . URI   (Consider location/radiation/quality/duration/timing/severity/associated sxs/prior Treatment) HPI Comments: 25 year old female states she has not been feeling well for the past 5 days. Somewhat nonspecific. She said mild sore throat, PND, chest pain at nighttime, occasional cough, ringing in the ears, voice changes and occasional mild shortness of breath. Denies fever. She states she does smoke occasionally but not in the past 5 days.   Past Medical History  Diagnosis Date  . No pertinent past medical history   . Chronic kidney disease 12/24/2013    acute renal failure  . Abscess    Past Surgical History  Procedure Laterality Date  . No past surgeries     Family History  Problem Relation Age of Onset  . Diabetes Mother   . Hypertension Mother   . Stroke Mother   . Heart disease Mother   . Diabetes Maternal Grandmother   . Cancer Father    Social History  Substance Use Topics  . Smoking status: Former Smoker    Quit date: 10/27/2013  . Smokeless tobacco: Never Used  . Alcohol Use: Yes   OB History    Gravida Para Term Preterm AB TAB SAB Ectopic Multiple Living   1 1 1       1      Review of Systems  Constitutional: Positive for activity change. Negative for fever, chills, appetite change and fatigue.  HENT: Positive for postnasal drip, rhinorrhea, sore throat and voice change. Negative for congestion and facial swelling.   Eyes: Negative.   Respiratory: Positive for cough.   Cardiovascular: Negative.   Musculoskeletal: Negative for neck pain and neck stiffness.  Skin: Negative for pallor and rash.  Neurological: Negative.     Allergies  Clindamycin/lincomycin and Nickel  Home Medications   Prior to Admission medications   Medication Sig Start Date End Date Taking? Authorizing Provider   acetaminophen (TYLENOL) 325 MG tablet Take 650 mg by mouth every 6 (six) hours as needed for mild pain.     Historical Provider, MD  amoxicillin-clavulanate (AUGMENTIN) 875-125 MG tablet Take 1 tablet by mouth every 12 (twelve) hours. 10/28/15   Maris BergerJonah Gunalda, MD  ibuprofen (ADVIL,MOTRIN) 200 MG tablet Take 800 mg by mouth every 6 (six) hours as needed for moderate pain (toothpain).    Historical Provider, MD  naproxen (NAPROSYN) 500 MG tablet Take 1 tablet (500 mg total) by mouth 2 (two) times daily. 10/28/15   Maris BergerJonah Gunalda, MD   Meds Ordered and Administered this Visit  Medications - No data to display  BP 124/81 mmHg  Pulse 74  Temp(Src) 98.8 F (37.1 C) (Oral)  Resp 16  SpO2 100%  LMP 04/10/2016 No data found.   Physical Exam  Constitutional: She is oriented to person, place, and time. She appears well-developed and well-nourished. No distress.  HENT:  Mouth/Throat: No oropharyngeal exudate.  Bilateral TMs are partially obscured by cerumen. The areas of the TM visualized are pearly gray and translucent. No erythema.  Oropharynx with mild to moderate amount of clear PND, minor injection and cobblestoning.  Eyes: Conjunctivae and EOM are normal.  Neck: Normal range of motion. Neck supple.  Cardiovascular: Normal rate, regular rhythm and normal heart sounds.   Pulmonary/Chest: Effort normal and breath sounds normal. No respiratory distress. She has no wheezes. She has no  rales.  Musculoskeletal: She exhibits no edema.  Neurological: She is alert and oriented to person, place, and time. She exhibits normal muscle tone.  Skin: Skin is warm and dry.  Nursing note and vitals reviewed.   ED Course  Procedures (including critical care time)  Labs Review Labs Reviewed  POCT RAPID STREP A   Results for orders placed or performed during the hospital encounter of 04/10/16  POCT rapid strep A [facility])  Result Value Ref Range   Streptococcus, Group A Screen (Direct)  NEGATIVE NEGATIVE     Imaging Review No results found.   Visual Acuity Review  Right Eye Distance:   Left Eye Distance:   Bilateral Distance:    Right Eye Near:   Left Eye Near:    Bilateral Near:         MDM   1. Other seasonal allergic rhinitis   2. Barotitis media, initial encounter    Allergic Rhinitis For nasal and head congestion may take Sudafed PE 10 mg every 4 hours as needed. Saline nasal spray used frequently. For drainage may use Allegra, Claritin or Zyrtec. If you need stronger medicine to stop drainage may take Chlor-Trimeton 2-4 mg every 4 hours. This may cause drowsiness. Ibuprofen 600 mg every 6 hours as needed for pain, discomfort or fever. Drink plenty of fluids and stay well-hydrated. Nasocort or flonase daily as needed Stop smoking   Hayden Rasmussen, NP 04/10/16 1717

## 2016-04-10 NOTE — ED Notes (Signed)
C/o cold sx onset x1 week associated w/cough, ST, fevers, chills, HA, bilateral ear pain A&O x4... No acute distress.

## 2016-04-10 NOTE — Discharge Instructions (Signed)
Allergic Rhinitis For nasal and head congestion may take Sudafed PE 10 mg every 4 hours as needed. Saline nasal spray used frequently. For drainage may use Allegra, Claritin or Zyrtec. If you need stronger medicine to stop drainage may take Chlor-Trimeton 2-4 mg every 4 hours. This may cause drowsiness. Ibuprofen 600 mg every 6 hours as needed for pain, discomfort or fever. Drink plenty of fluids and stay well-hydrated. Nasocort or flonase daily as needed Stop smoking Allergic rhinitis is when the mucous membranes in the nose respond to allergens. Allergens are particles in the air that cause your body to have an allergic reaction. This causes you to release allergic antibodies. Through a chain of events, these eventually cause you to release histamine into the blood stream. Although meant to protect the body, it is this release of histamine that causes your discomfort, such as frequent sneezing, congestion, and an itchy, runny nose.  CAUSES Seasonal allergic rhinitis (hay fever) is caused by pollen allergens that may come from grasses, trees, and weeds. Year-round allergic rhinitis (perennial allergic rhinitis) is caused by allergens such as house dust mites, pet dander, and mold spores. SYMPTOMS  Nasal stuffiness (congestion).  Itchy, runny nose with sneezing and tearing of the eyes. DIAGNOSIS Your health care provider can help you determine the allergen or allergens that trigger your symptoms. If you and your health care provider are unable to determine the allergen, skin or blood testing may be used. Your health care provider will diagnose your condition after taking your health history and performing a physical exam. Your health care provider may assess you for other related conditions, such as asthma, pink eye, or an ear infection. TREATMENT Allergic rhinitis does not have a cure, but it can be controlled by:  Medicines that block allergy symptoms. These may include allergy shots, nasal  sprays, and oral antihistamines.  Avoiding the allergen. Hay fever may often be treated with antihistamines in pill or nasal spray forms. Antihistamines block the effects of histamine. There are over-the-counter medicines that may help with nasal congestion and swelling around the eyes. Check with your health care provider before taking or giving this medicine. If avoiding the allergen or the medicine prescribed do not work, there are many new medicines your health care provider can prescribe. Stronger medicine may be used if initial measures are ineffective. Desensitizing injections can be used if medicine and avoidance does not work. Desensitization is when a patient is given ongoing shots until the body becomes less sensitive to the allergen. Make sure you follow up with your health care provider if problems continue. HOME CARE INSTRUCTIONS It is not possible to completely avoid allergens, but you can reduce your symptoms by taking steps to limit your exposure to them. It helps to know exactly what you are allergic to so that you can avoid your specific triggers. SEEK MEDICAL CARE IF:  You have a fever.  You develop a cough that does not stop easily (persistent).  You have shortness of breath.  You start wheezing.  Symptoms interfere with normal daily activities.   This information is not intended to replace advice given to you by your health care provider. Make sure you discuss any questions you have with your health care provider.   Document Released: 08/16/2001 Document Revised: 12/12/2014 Document Reviewed: 07/29/2013 Else[employer] Interactive Patient Education [employer]2016 [employer]

## 2016-04-12 LAB — CULTURE, GROUP A STREP (THRC)

## 2016-04-14 ENCOUNTER — Telehealth (HOSPITAL_COMMUNITY): Payer: Self-pay | Admitting: Emergency Medicine

## 2016-04-14 NOTE — ED Notes (Signed)
Called pt and notified of recent lab results from visit 5/7 Pt ID'd properly... Reports feeling better and sx have subsided  Per Dr. Dayton ScrapeMurray,  Throat culture was weakly positive for strep.  If patient is still having significant sore throat or fever >100.5, could treat with penicillin V 500mg  bid x 10d, #20 no refill.  Note sent to patient's MyChart, for her to call in to discuss further. LM  Adv pt if sx are not getting better to return  Pt verb understanding

## 2016-12-26 ENCOUNTER — Other Ambulatory Visit: Payer: Self-pay

## 2016-12-26 DIAGNOSIS — N63 Unspecified lump in unspecified breast: Secondary | ICD-10-CM

## 2018-05-16 ENCOUNTER — Ambulatory Visit (HOSPITAL_COMMUNITY)
Admission: EM | Admit: 2018-05-16 | Discharge: 2018-05-16 | Disposition: A | Discharge status: Home / Self Care | Payer: Self-pay | Attending: Family Medicine | Admitting: Family Medicine

## 2018-05-16 ENCOUNTER — Encounter (HOSPITAL_COMMUNITY): Payer: Self-pay | Admitting: Emergency Medicine

## 2018-05-16 DIAGNOSIS — R21 Rash and other nonspecific skin eruption: Secondary | ICD-10-CM

## 2018-05-16 HISTORY — DX: Dermatitis, unspecified: L30.9

## 2018-05-16 MED ORDER — PREDNISONE 50 MG PO TABS: 50.0000 mg | ORAL_TABLET | Freq: Every day | ORAL | 0 refills | Status: AC

## 2018-05-16 MED ORDER — TRIAMCINOLONE ACETONIDE 0.5 % EX OINT: 1.0000 "application " | TOPICAL_OINTMENT | Freq: Two times a day (BID) | CUTANEOUS | 0 refills | Status: DC

## 2018-05-16 NOTE — Discharge Instructions (Signed)
Rash on hand is consistent with eczema. Start prednisone as directed. Triamcinolone ointment as directed. Avoid use of soap to the hands for now. Monitor for worsening symptoms, spreading redness, increased warmth, fever, follow up for reevaluation.

## 2018-05-16 NOTE — ED Triage Notes (Signed)
Pt sts rash from eczema to hands

## 2018-05-16 NOTE — ED Provider Notes (Signed)
[facility]    CSN: [id number] Arrival date & time: 05/16/18  1258     History   Chief Complaint Chief Complaint  Patient presents with  . Rash    HPI Amanda Beasley is a 27 y.o. female.   27 year old female comes in for 2-week history of rash to the hands.  States started out as a few bumps, and has since spread.  Rash is itching in nature.  States itching more at night, and with hot water.  Denies erythema, increased warmth, fever.  States has history of eczema, but has not had a flare many years, and never in the hands. 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 She denies other family members itching.      Past Medical History:  Diagnosis Date  . Abscess   . Chronic kidney disease 12/24/2013   acute renal failure  . Eczema   . No pertinent past medical history     Patient Active Problem List   Diagnosis Date Noted  . Intermenstrual bleeding 03/16/2015  . Nausea and vomiting 12/26/2013  . 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 Acute renal failure (HCC) 12/19/2013  . Tobacco abuse 12/16/2013  . Marijuana use 12/16/2013  . Hypokalemia 12/16/2013    Past Surgical History:  Procedure Laterality Date  . NO PAST SURGERIES      OB History    Gravida  1   Para  1   Term  1   Preterm      AB      Living  1     SAB      TAB      Ectopic      Multiple      Live Births  1            Home Medications    Prior to Admission medications   Medication Sig Start Date End Date Taking? Authorizing Provider  acetaminophen (TYLENOL) 325 MG tablet Take 650 mg by mouth every 6 (six) hours as needed for mild pain.     [provider]  amoxicillin-clavulanate (AUGMENTIN) 875-125 MG tablet Take 1 tablet by mouth every 12 (twelve) hours. 10/28/15   Maris BergerGunalda, Jonah, MD  ibuprofen (ADVIL,MOTRIN) 200 MG tablet Take 800 mg by mouth every  6 (six) hours as needed for moderate pain (toothpain).    [provider]  naproxen (NAPROSYN) 500 MG tablet Take 1 tablet (500 mg total) by mouth 2 (two) times daily. 10/28/15   Maris BergerGunalda, Jonah, MD  predniSONE (DELTASONE) 50 MG tablet Take 1 tablet (50 mg total) by mouth daily for 5 days. 05/16/18 05/21/18  Cathie HoopsYu, Amy V, PA-C  triamcinolone ointment (KENALOG) 0.5 % Apply 1 application topically 2 (two) times daily. 05/16/18   Belinda FisherYu, Amy V, PA-C    Family History Family History  Problem Relation Age of Onset  . Diabetes Mother   . Hypertension Mother   . Stroke Mother   . Heart disease Mother   . Cancer Father   . Diabetes Maternal Grandmother     Social History Social History   Tobacco Use  . Smoking status: Former Smoker    Last attempt to quit: 10/27/2013    Years since quitting: 4.5  . Smokeless tobacco: Never Used  Substance Use Topics  . Alcohol use: Yes  . Drug use: No     Allergies   Clindamycin/lincomycin and Nickel  Review of Systems Review of Systems  Reason unable to perform ROS: See HPI as above.     Physical Exam Triage Vital Signs ED Triage Vitals [05/16/18 1343]  Enc Vitals Group     BP (!) 150/79     Pulse Rate 96     Resp 18     Temp 98.6 F (37 C)     Temp Source Oral     SpO2 100 %     Weight      Height      Head Circumference      Peak Flow      Pain Score      Pain Loc      Pain Edu?      Excl. in GC?    No data found.  Updated Vital Signs BP (!) 150/79 (BP Location: Left Arm)   Pulse 96   Temp 98.6 F (37 C) (Oral)   Resp 18   SpO2 100%   Physical Exam  Constitutional: She is oriented to person, place, and time. She appears well-developed and well-nourished. No distress.  HENT:  Head: Normocephalic and atraumatic.  Eyes: Pupils are equal, round, and reactive to light. Conjunctivae are normal.  Neurological: She is alert and oriented to person, place, and time.  Skin: Skin is warm and dry.  See pictures below.                   UC Treatments / Results  Labs (all labs ordered are listed, but only abnormal results are displayed) Labs Reviewed - No data to display  EKG None  Radiology No results found.  Procedures Procedures (including critical care time)  Medications Ordered in UC Medications - No data to display  Initial Impression / Assessment and Plan / UC Course  I have reviewed the triage vital signs and the nursing notes.  Pertinent labs & imaging results that were available during my care of the patient were reviewed by me and considered in my medical decision making (see chart for details).    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 Discussed possible scabies as well, however, given no itching in other parts of body, and now exposure, monitor for now. Return precautions given. Patient expresses understanding and agrees to plan.  Final Clinical Impressions(s) / UC Diagnoses   Final diagnoses:  Rash    ED Prescriptions    Medication Sig Dispense Auth. Provider   triamcinolone ointment (KENALOG) 0.5 % Apply 1 application topically 2 (two) times daily. 30 g Yu, Amy V, PA-C   predniSONE (DELTASONE) 50 MG tablet Take 1 tablet (50 mg total) by mouth daily for 5 days. 5 tablet Threasa Alpha, [place] 05/16/18 1423

## 2018-07-11 ENCOUNTER — Telehealth: Payer: Self-pay | Admitting: Family

## 2018-07-11 DIAGNOSIS — L309 Dermatitis, unspecified: Secondary | ICD-10-CM

## 2018-07-11 DIAGNOSIS — A599 Trichomoniasis, unspecified: Secondary | ICD-10-CM

## 2018-07-11 MED ORDER — PREDNISONE 10 MG (21) PO TBPK: ORAL_TABLET | ORAL | 0 refills | Status: DC

## 2018-07-11 MED ORDER — METRONIDAZOLE 500 MG PO TABS: 2000.0000 mg | ORAL_TABLET | Freq: Once | ORAL | 0 refills | Status: AC

## 2018-07-11 NOTE — Progress Notes (Signed)
E Visit for Rash  We are sorry that you are not feeling well. Here is how we plan to help!  Based on what you shared with me it looks like you have contact dermatitis.  Contact dermatitis is a skin rash caused by something that touches the skin and causes irritation or inflammation.  Your skin may be red, swollen, dry, cracked, and itch.  The rash should go away in a few days but can last a few weeks.  If you get a rash, it's important to figure out what caused it so the irritant can be avoided in the future.   Prednisone 10 mg daily for 6 days (see taper instructions below)  Directions for 6 day taper: Day 1: 2 tablets before breakfast, 1 after both lunch & dinner and 2 at bedtime Day 2: 1 tab before breakfast, 1 after both lunch & dinner and 2 at bedtime Day 3: 1 tab at each meal & 1 at bedtime Day 4: 1 tab at breakfast, 1 at lunch, 1 at bedtime Day 5: 1 tab at breakfast & 1 tab at bedtime Day 6: 1 tab at breakfast  I have also sent and Flagyl 2 g take one time.    HOME CARE:   Take cool showers and avoid direct sunlight.  Apply cool compress or wet dressings.  Take a bath in an oatmeal bath.  Sprinkle content of one Aveeno packet under running faucet with comfortably warm water.  Bathe for 15-20 minutes, 1-2 times daily.  Pat dry with a towel. Do not rub the rash.  Use hydrocortisone cream.  Take an antihistamine like Benadryl for widespread rashes that itch.  The adult dose of Benadryl is 25-50 mg by mouth 4 times daily.  Caution:  This type of medication may cause sleepiness.  Do not drink alcohol, drive, or operate dangerous machinery while taking antihistamines.  Do not take these medications if you have prostate enlargement.  Read package instructions thoroughly on all medications that you take.  GET HELP RIGHT AWAY IF:   Symptoms don't go away after treatment.  Severe itching that persists.  If you rash spreads or swells.  If you rash begins to smell.  If it  blisters and opens or develops a yellow-brown crust.  You develop a fever.  You have a sore throat.  You become short of breath.  MAKE SURE YOU:  Understand these instructions. Will watch your condition. Will get help right away if you are not doing well or get worse.  Thank you for choosing an e-visit. Your e-visit answers were reviewed by a board certified advanced clinical practitioner to complete your personal care plan. Depending upon the condition, your plan could have included both over the counter or prescription medications. Please review your pharmacy choice. Be sure that the pharmacy you have chosen is open so that you can pick up your prescription now.  If there is a problem you may message your provider in MyChart to have the prescription routed to another pharmacy. Your safety is important to us. If you have drug allergies check your prescription carefully.  For the next 24 hours, you can use MyChart to ask questions about today's visit, request a non-urgent call back, or ask for a work or school excuse from your e-visit provider. You will get an email in the next two days asking about your experience. I hope that your e-visit has been valuable and will speed your recovery.

## 2019-05-03 ENCOUNTER — Encounter: Payer: Self-pay | Admitting: Family

## 2019-05-03 ENCOUNTER — Other Ambulatory Visit: Payer: Self-pay

## 2019-05-03 ENCOUNTER — Telehealth: Payer: Self-pay | Admitting: Family

## 2019-05-03 DIAGNOSIS — L309 Dermatitis, unspecified: Secondary | ICD-10-CM

## 2019-05-03 DIAGNOSIS — M5440 Lumbago with sciatica, unspecified side: Secondary | ICD-10-CM

## 2019-05-03 MED ORDER — NAPROXEN 500 MG PO TABS: 500.0000 mg | ORAL_TABLET | Freq: Two times a day (BID) | ORAL | 0 refills | Status: DC

## 2019-05-03 MED ORDER — CYCLOBENZAPRINE HCL 10 MG PO TABS: 10.0000 mg | ORAL_TABLET | Freq: Three times a day (TID) | ORAL | 0 refills | Status: DC | PRN

## 2019-05-03 NOTE — Progress Notes (Signed)
We are sorry that you are not feeling well.  Here is how we plan to help!  Based on what you have shared with me it looks like you mostly have acute back pain.  Acute back pain is defined as musculoskeletal pain that can resolve in 1-3 weeks with conservative treatment.  I have prescribed Naprosyn 500 mg twice a day non-steroid anti-inflammatory (NSAID) as well as Flexeril 10 mg every eight hours as needed which is a muscle relaxer  Some patients experience stomach irritation or in increased heartburn with anti-inflammatory drugs.  Please keep in mind that muscle relaxer's can cause fatigue and should not be taken while at work or driving.  Back pain is very common.  The pain often gets better over time.  The cause of back pain is usually not dangerous.  Most people can learn to manage their back pain on their own.  Home Care  Stay active.  Start with short walks on flat ground if you can.  Try to walk farther each day.  Do not sit, drive or stand in one place for more than 30 minutes.  Do not stay in bed.  Do not avoid exercise or work.  Activity can help your back heal faster.  Be careful when you bend or lift an object.  Bend at your knees, keep the object close to you, and do not twist.  Sleep on a firm mattress.  Lie on your side, and bend your knees.  If you lie on your back, put a pillow under your knees.  Only take medicines as told by your doctor.  Put ice on the injured area.  Put ice in a plastic bag  Place a towel between your skin and the bag  Leave the ice on for 15-20 minutes, 3-4 times a day for the first 2-3 days. 210 After that, you can switch between ice and heat packs.  Ask your doctor about back exercises or massage.  Avoid feeling anxious or stressed.  Find good ways to deal with stress, such as exercise.  Get Help Right Way If:  Your pain does not go away with rest or medicine.  Your pain does not go away in 1 week.  You have new problems.  You do not  feel well.  The pain spreads into your legs.  You cannot control when you poop (bowel movement) or pee (urinate)  You feel sick to your stomach (nauseous) or throw up (vomit)  You have belly (abdominal) pain.  You feel like you may pass out (faint).  If you develop a fever.  Make Sure you:  Understand these instructions.  Will watch your condition  Will get help right away if you are not doing well or get worse.  Your e-visit answers were reviewed by a board certified advanced clinical practitioner to complete your personal care plan.  Depending on the condition, your plan could have included both over the counter or prescription medications.  If there is a problem please reply  once you have received a response from your provider.  Your safety is important to us.  If you have drug allergies check your prescription carefully.    You can use MyChart to ask questions about today's visit, request a non-urgent call back, or ask for a work or school excuse for 24 hours related to this e-Visit. If it has been greater than 24 hours you will need to follow up with your provider, or enter a new e-Visit to address   those concerns.  You will get an e-mail in the next two days asking about your experience.  I hope that your e-visit has been valuable and will speed your recovery. Thank you for using e-visits.   Greater than 5 minutes, yet less than 10 minutes of time have been spent researching, coordinating, and implementing care for this patient today.  Thank you for the details you included in the comment boxes. Those details are very helpful in determining the best course of treatment for you and help us to provide the best care.  

## 2019-05-15 ENCOUNTER — Telehealth: Payer: Self-pay | Admitting: Family

## 2019-05-15 DIAGNOSIS — L301 Dyshidrosis [pompholyx]: Secondary | ICD-10-CM

## 2019-05-15 MED ORDER — TRIAMCINOLONE ACETONIDE 0.5 % EX OINT: 1.0000 "application " | TOPICAL_OINTMENT | Freq: Two times a day (BID) | CUTANEOUS | 0 refills | Status: DC

## 2019-05-15 MED ORDER — PREDNISONE 10 MG (21) PO TBPK: ORAL_TABLET | ORAL | 0 refills | Status: DC

## 2019-05-15 NOTE — Progress Notes (Signed)
E Visit for Rash  We are sorry that you are not feeling well. Here is how we plan to help!  Based on your symptoms it looks like to me that you have dyshidrotic eczema. Your rash does look severe, so I do recommend you to follow up with your PCP.  Approximately 5 minutes was spent documenting and reviewing patient's chart.   Prednisone 10 mg daily for 6 days (see taper instructions below)  Directions for 6 day taper: Day 1: 2 tablets before breakfast, 1 after both lunch & dinner and 2 at bedtime Day 2: 1 tab before breakfast, 1 after both lunch & dinner and 2 at bedtime Day 3: 1 tab at each meal & 1 at bedtime Day 4: 1 tab at breakfast, 1 at lunch, 1 at bedtime Day 5: 1 tab at breakfast & 1 tab at bedtime Day 6: 1 tab at breakfast     HOME CARE:   Take cool showers and avoid direct sunlight.  Apply cool compress or wet dressings.  Take a bath in an oatmeal bath.  Sprinkle content of one Aveeno packet under running faucet with comfortably warm water.  Bathe for 15-20 minutes, 1-2 times daily.  Pat dry with a towel. Do not rub the rash.  Use hydrocortisone cream.  Take an antihistamine like Benadryl for widespread rashes that itch.  The adult dose of Benadryl is 25-50 mg by mouth 4 times daily.  Caution:  This type of medication may cause sleepiness.  Do not drink alcohol, drive, or operate dangerous machinery while taking antihistamines.  Do not take these medications if you have prostate enlargement.  Read package instructions thoroughly on all medications that you take.  GET HELP RIGHT AWAY IF:   Symptoms don't go away after treatment.  Severe itching that persists.  If you rash spreads or swells.  If you rash begins to smell.  If it blisters and opens or develops a yellow-brown crust.  You develop a fever.  You have a sore throat.  You become short of breath.  MAKE SURE YOU:  Understand these instructions. Will watch your condition. Will get help right  away if you are not doing well or get worse.  Thank you for choosing an e-visit. Your e-visit answers were reviewed by a board certified advanced clinical practitioner to complete your personal care plan. Depending upon the condition, your plan could have included both over the counter or prescription medications. Please review your pharmacy choice. Be sure that the pharmacy you have chosen is open so that you can pick up your prescription now.  If there is a problem you may message your provider in [place] to have the prescription routed to another pharmacy. Your safety is important to [place]. If you have drug allergies check your prescription carefully.  For the next 24 hours, you can use MyChart to ask questions about today's visit, request a non-urgent call back, or ask for a work or school excuse from your e-visit provider. You will get an email in the next two days asking about your experience. I hope that your e-visit has been valuable and will speed your recovery.

## 2019-05-28 ENCOUNTER — Telehealth: Payer: Self-pay | Admitting: Physician Assistant

## 2019-05-28 DIAGNOSIS — F410 Panic disorder [episodic paroxysmal anxiety] without agoraphobia: Secondary | ICD-10-CM

## 2019-05-28 NOTE — Progress Notes (Signed)
Based on what you shared with me, I feel your condition warrants further evaluation and I recommend that you be seen for a face to face office visit.  NOTE: If you entered your credit card information for this eVisit, you will not be charged. You may see a "hold" on your card for the $35 but that hold will drop off and you will not have a charge processed.  If you are having a true medical emergency please call 911.     For an urgent face to face visit, [place] has five urgent care centers for your convenience:  ?  [email or web address] to reserve your spot online an avoid wait times  [facility] [address], [address] [place], [place] [address] Modified hours of operation: Monday-Friday, 12 PM to 6 PM  Closed Saturday & Sunday  *Across the street from [employer]  [facility] (New Address!) [address], [address] [place], [place] [address] *Just off [address], across the road from [employer]* Modified hours of operation: Monday-Friday, 12 PM to 6 PM  Closed Saturday & Sunday   The following sites will take your insurance:  [facility]    [phone]                  Get Driving Directions  [id number] [place], [place] [address] 10 am to 8 pm Monday-Friday 12 pm to 8 pm [facility]   [place] [facility]  [phone]                  Get Driving Directions  [id number] [place] [address], [address] [place], [place] [address] 8 am to 8 pm Monday-Friday 9 am to 6 pm Saturday 11 am to 6 pm Sunday   [facility] [place]  [phone]                  Get Driving Directions   [id number] [place], [place] [address] 8 am to 8 pm Monday-Friday 8 am to 4 pm [facility]    [place] [facility]                    Get Driving Directions  [phone]  [address] [place], [place] [address]  Monday-Friday, 12  PM to 6 PM    Your e-visit answers were reviewed by a board certified advanced clinical practitioner to complete your personal care plan.  Thank you for using e-Visits.  ===View-only below this line===   ----- Message -----    From: Amanda Beasley    Sent: 05/28/2019 12:55 PM EDT      To: E-Visit Mailing List Subject: Nausea & Vomiting  Nausea & Vomiting --------------------------------  Question: What is your primary symptom? Answer:   Nausea  Question: Have you experienced fever or chills with your symptoms? Answer:   No  Question: How long have you been vomiting? Answer:   More than 3 days  Question: How often have you been vomiting? Answer:   1-5 times a day  Question: Is there a red or maroon color or the appearance of coffee grounds in the vomit? Answer:   No  Question: Do you have any of the following symptoms? (check all that apply) Answer:   Migraine  Question: Other symptoms or additional comments: Answer:   I'm having episodes of intense nausea throughout the day but no vomitting and trouble breathing where  im breathing fast and heavy. I've been shaky and unable to focus or pay attention. My heart rate increases and I get panicky. This has been going on for a week and is causing problems for me at work.  Question: Are you taking any of the following medications? Answer:     Question: Have you tried any over the counter medications?  Answer:   No  Question: If Yes, please indicate which of the following over the counter medications you have tried (Check all that apply) Answer:     Question: Other over the counter medications you have tried or comments: Answer:     Question: Were any of the medications effective? Answer:   No  Question: Is your mouth dry? Answer:   Yes  Question: Have you urinated? Answer:   Yes  Question: Was your urine dark? Answer:   No  Question: Are you able to keep down liquids? Answer:   Yes  Question: Please list  your medication allergies that you may have ? (If 'none' , please list as 'none') Answer:   Clindamycin  Question: Are you pregnant? Answer:   I am confident that I am not pregnant  Question: Are you breastfeeding? Answer:   No  Question: Please list any additional comments  Answer:   My mother recently passed away and I am not sure if that has any relation to what I'm experiencing like maybe a panic or anxiety attack or if I really am sick. Based on what you shared with me, I feel your condition warrants further evaluation and I recommend that you be seen for a face to face office visit.  NOTE: If you entered your credit card information for this eVisit, you will not be charged. You may see a "hold" on your card for the $35 but that hold will drop off and you will not have a charge processed.  If you are having a true medical emergency please call 911.     For an urgent face to face visit, [place] has five urgent care centers for your convenience:  ?  [email or web address] to reserve your spot online an avoid wait times  [facility] [address], [address] [place], [place] [address] Modified hours of operation: Monday-Friday, 12 PM to 6 PM  Closed Saturday & Sunday  *Across the street from [employer]  [facility] (New Address!) [address], [address] [place], [place] [address] *Just off [address], across the road from [employer]* Modified hours of operation: Monday-Friday, 12 PM to 6 PM  Closed Saturday & Sunday   The following sites will take your insurance:  [facility]    [phone]                  Get Driving Directions  [address] [place], [place] [address] 10 am to 8 pm Monday-Friday 12 pm to 8 pm [facility]-Sunday   [place] [facility]  [phone]                  Get Driving Directions  [id number] [place] [address], [address] [place], [place] [address] 8 am to  8 pm Monday-Friday 9 am to 6 pm Saturday 11 am to 6 pm Sunday   [facility] [place]  [phone]                  Get Driving Directions   [address] [place],  [place] [address] 8 am to 8 pm Monday-Friday 8 am to 4 pm [facility] at [place]                    Get Driving Directions  [phone]  [address] [place], [place] [address]  Monday-Friday, 12 PM to 6 PM    Your e-visit answers were reviewed by a board certified advanced clinical practitioner to complete your personal care plan.  Thank you for using e-Visits.  ===View-only below this line===   ----- Message -----    From: Amanda Beasley    Sent: 05/28/2019 12:55 PM EDT      To: E-Visit Mailing List Subject: Nausea & Vomiting  Nausea & Vomiting --------------------------------  Question: What is your primary symptom? Answer:   Nausea  Question: Have you experienced fever or chills with your symptoms? Answer:   No  Question: How long have you been vomiting? Answer:   More than 3 days  Question: How often have you been vomiting? Answer:   1-5 times a day  Question: Is there a red or maroon color or the appearance of coffee grounds in the vomit? Answer:   No  Question: Do you have any of the following symptoms? (check all that apply) Answer:   Migraine  Question: Other symptoms or additional comments: Answer:   I'm having episodes of intense nausea throughout the day but no vomitting and trouble breathing where im breathing fast and heavy. I've been shaky and unable to focus or pay attention. My heart rate increases and I get panicky. This has been going on for a week and is causing problems for me at work.  Question: Are you taking any of the following medications? Answer:     Question: Have you tried any over the counter medications?  Answer:   No  Question: If Yes, please indicate which of the following over  the counter medications you have tried (Check all that apply) Answer:     Question: Other over the counter medications you have tried or comments: Answer:     Question: Were any of the medications effective? Answer:   No  Question: Is your mouth dry? Answer:   Yes  Question: Have you urinated? Answer:   Yes  Question: Was your urine dark? Answer:   No  Question: Are you able to keep down liquids? Answer:   Yes  Question: Please list your medication allergies that you may have ? (If 'none' , please list as 'none') Answer:   Clindamycin  Question: Are you pregnant? Answer:   I am confident that I am not pregnant  Question: Are you breastfeeding? Answer:   No  Question: Please list any additional comments  Answer:   My mother recently passed away and I am not sure if that has any relation to what I'm experiencing like maybe a panic or anxiety attack or if I really am sick.  A total of 5-10 minutes was spent evaluating this patients questionnaire and formulating a plan of care.

## 2019-10-24 ENCOUNTER — Telehealth: Payer: Self-pay | Admitting: Emergency Medicine

## 2019-10-24 DIAGNOSIS — R300 Dysuria: Secondary | ICD-10-CM

## 2019-10-24 MED ORDER — CEPHALEXIN 500 MG PO CAPS: 500.0000 mg | ORAL_CAPSULE | Freq: Two times a day (BID) | ORAL | 0 refills | Status: DC

## 2019-10-24 NOTE — Progress Notes (Signed)

## 2019-11-25 ENCOUNTER — Telehealth: Payer: Self-pay | Admitting: Physician Assistant

## 2019-11-25 DIAGNOSIS — R239 Unspecified skin changes: Secondary | ICD-10-CM

## 2019-11-25 DIAGNOSIS — K0889 Other specified disorders of teeth and supporting structures: Secondary | ICD-10-CM

## 2019-11-25 MED ORDER — PENICILLIN V POTASSIUM 500 MG PO TABS: 500.0000 mg | ORAL_TABLET | Freq: Four times a day (QID) | ORAL | 0 refills | Status: AC

## 2019-11-25 MED ORDER — IBUPROFEN 600 MG PO TABS: 600.0000 mg | ORAL_TABLET | Freq: Three times a day (TID) | ORAL | 0 refills | Status: DC | PRN

## 2019-11-25 MED ORDER — CHLORHEXIDINE GLUCONATE 0.12 % MT SOLN: 15.0000 mL | Freq: Two times a day (BID) | OROMUCOSAL | 0 refills | Status: DC

## 2019-11-25 NOTE — Progress Notes (Signed)
Based on what you shared with me, I feel your condition warrants further evaluation and I recommend that you be seen for a face to face office visit.  Without being able to see the area and with no pictures attached, I am unable to assess what may be going on. I would recommend seeking face to face evaluation at an urgent care (see below) for further evaluation.    NOTE: If you entered your credit card information for this eVisit, you will not be charged. You may see a "hold" on your card for the $35 but that hold will drop off and you will not have a charge processed.   If you are having a true medical emergency please call 911.      For an urgent face to face visit, [place] has five urgent care centers for your convenience:      NEW:  [facility] [place] Get Driving Directions [phone] [address], [place] [address] . 10 am - 6pm Monday - Friday    [place] [facility] [place] [facility]) Get Driving Directions [phone] [address] [place], [place] [address] . 10 am to 8 pm Monday-Friday . 12 pm to 8 pm [facility] at [facility] Get Driving Directions [phone] [place], [place], [place] [address] . 8 am to 8 pm Monday-Friday . 9 am to 6 pm Saturday . 11 am to 6 pm Sunday     [facility] [place] Get Driving Directions  [phone] [address] [place], [place] [address] . 8 am to 8 pm Monday-Friday . 8 am to 4 pm [facility] at [place] Get Driving Directions [phone] [address] [place], [place] [address] . 12 pm to 6 pm Monday-Friday      Your e-visit answers were reviewed by a board certified advanced clinical practitioner to complete your personal care plan.  Thank you for using e-Visits.   Greater than 5 minutes, yet less than 10 minutes of time  have been spent researching, coordinating, and implementing care for this patient today.

## 2019-11-25 NOTE — Addendum Note (Signed)
Addended by: Rodell Perna A on: 11/25/2019 09:36 AM   Modules accepted: Orders

## 2020-02-13 ENCOUNTER — Telehealth: Payer: Self-pay | Admitting: Family

## 2020-02-13 DIAGNOSIS — H60331 Swimmer's ear, right ear: Secondary | ICD-10-CM

## 2020-02-13 MED ORDER — NEOMYCIN-POLYMYXIN-HC 3.5-10000-1 OT SOLN: 3.0000 [drp] | Freq: Four times a day (QID) | OTIC | 0 refills | Status: DC

## 2020-02-13 NOTE — Progress Notes (Signed)
E Visit for Swimmer's Ear  We are sorry that you are not feeling well. Here is how we plan to help!  I have prescribed: Neomycin 0.35%, polymyxin B 10,000 units/mL, and hydrocortisone 0,5% otic solution 4 drops in affected ears four times a day for 7 days   In certain cases swimmer's ear may progress to a more serious bacterial infection of the middle or inner ear.  If you have a fever 102 and up and significantly worsening symptoms, this could indicate a more serious infection moving to the middle/inner and needs face to face evaluation in an office by a provider.  Your symptoms should improve over the next 3 days and should resolve in about 7 days.  HOME CARE:   Wash your hands frequently.  Do not place the tip of the bottle on your ear or touch it with your fingers.  You can take Acetominophen 650 mg every 4-6 hours as needed for pain.  If pain is severe or moderate, you can apply a heating pad (set on low) or hot water bottle (wrapped in a towel) to outer ear for 20 minutes.  This will also increase drainage.  Avoid ear plugs  Do not use Q-tips  After showers, help the water run out by tilting your head to one side.  GET HELP RIGHT AWAY IF:   Fever is over 102.2 degrees.  You develop progressive ear pain or hearing loss.  Ear symptoms persist longer than 3 days after treatment.  MAKE SURE YOU:   Understand these instructions.  Will watch your condition.  Will get help right away if you are not doing well or get worse.  TO PREVENT SWIMMER'S EAR:  Use a bathing cap or custom fitted swim molds to keep your ears dry.  Towel off after swimming to dry your ears.  Tilt your head or pull your earlobes to allow the water to escape your ear canal.  If there is still water in your ears, consider using a hairdryer on the lowest setting.  Thank you for choosing an e-visit. Your e-visit answers were reviewed by a board certified advanced clinical practitioner to complete  your personal care plan. Depending upon the condition, your plan could have included both over the counter or prescription medications. Please review your pharmacy choice. Be sure that the pharmacy you have chosen is open so that you can pick up your prescription now.  If there is a problem you may message your provider in MyChart to have the prescription routed to another pharmacy. Your safety is important to us. If you have drug allergies check your prescription carefully.  For the next 24 hours, you can use MyChart to ask questions about today's visit, request a non-urgent call back, or ask for a work or school excuse from your e-visit provider. You will get an email in the next two days asking about your experience. I hope that your e-visit has been valuable and will speed your recovery.    Greater than 5 minutes, yet less than 10 minutes of time have been spent researching, coordinating, and implementing care for this patient today.  Thank you for the details you included in the comment boxes. Those details are very helpful in determining the best course of treatment for you and help us to provide the best care.  

## 2020-02-18 ENCOUNTER — Other Ambulatory Visit: Payer: Self-pay

## 2020-02-18 ENCOUNTER — Ambulatory Visit (HOSPITAL_COMMUNITY)
Admission: EM | Admit: 2020-02-18 | Discharge: 2020-02-18 | Disposition: A | Discharge status: Home / Self Care | Payer: Self-pay | Attending: Family Medicine | Admitting: Family Medicine

## 2020-02-18 DIAGNOSIS — H9201 Otalgia, right ear: Secondary | ICD-10-CM

## 2020-02-18 DIAGNOSIS — H6123 Impacted cerumen, bilateral: Secondary | ICD-10-CM

## 2020-02-18 NOTE — Discharge Instructions (Signed)
Your ears were clogged with wax. They were cleaned out in the office.  If you are experiencing discomfort, you may take ibuprofen or tylenol as needed.   Follow up with our office as needed.

## 2020-02-18 NOTE — ED Triage Notes (Signed)
Pt c/o right ear pressure also c/o head pain to right temporal area, body aches, . Denies sore throat, cough, fever, chills, abd pain, n/v/d.  States she had a virtual visit on 03/11 and was rx abx ear drops. States ear pressure has improved slightly, but it still feels like "something is in my ear."

## 2020-02-18 NOTE — ED Provider Notes (Signed)
[facility]    CSN: [id number] Arrival date & time: 02/18/20  1532      History   Chief Complaint Chief Complaint  Patient presents with  . Otalgia    HPI Amanda Beasley is a 29 y.o. female.   Reports that she is having right ear pressure for the last "few days."  Reports that she is used neomycin eardrops and that they have helped a little bit, but she feels like she still has something in her ear.  Denies fever, headache, body aches, nausea, vomiting, diarrhea, shortness of breath, sore throat, rash, other symptoms.  ROS per HPI  The history is provided by the patient.    Past Medical History:  Diagnosis Date  . Abscess   . Chronic kidney disease 12/24/2013   acute renal failure  . Eczema   . No pertinent past medical history     Patient Active Problem List   Diagnosis Date Noted  . Intermenstrual bleeding 03/16/2015  . Nausea and vomiting 12/26/2013  . 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 Acute renal failure (HCC) 12/19/2013  . Tobacco abuse 12/16/2013  . Marijuana use 12/16/2013  . Hypokalemia 12/16/2013    Past Surgical History:  Procedure Laterality Date  . NO PAST SURGERIES      OB History    Gravida  1   Para  1   Term  1   Preterm      AB      Living  1     SAB      TAB      Ectopic      Multiple      Live Births  1            Home Medications    Prior to Admission medications   Medication Sig Start Date End Date Taking? Authorizing Provider  neomycin-polymyxin-hydrocortisone (CORTISPORIN) OTIC solution Place 3 drops into both ears 4 (four) times daily. 02/13/20  Yes Worthy Rancher B, FNP  acetaminophen (TYLENOL) 325 MG tablet Take 650 mg by mouth every 6 (six) hours as needed for mild pain.     [provider]  amoxicillin-clavulanate (AUGMENTIN) 875-125 MG tablet Take 1 tablet by mouth every 12 (twelve) hours. 10/28/15   Maris Berger, MD  cephALEXin (KEFLEX) 500 MG capsule Take 1  capsule (500 mg total) by mouth 2 (two) times daily. 2 caps po bid x 7 days 10/24/19   Arthor Captain, PA-C  chlorhexidine (PERIDEX) 0.12 % solution Use as directed 15 mLs in the mouth or throat 2 (two) times daily. 11/25/19   Fawze, Mina A, PA-C  cyclobenzaprine (FLEXERIL) 10 MG tablet Take 1 tablet (10 mg total) by mouth 3 (three) times daily as needed for muscle spasms. 05/03/19   Worthy Rancher B, FNP  ibuprofen (ADVIL) 600 MG tablet Take 1 tablet (600 mg total) by mouth every 8 (eight) hours as needed. 11/25/19   Luevenia Maxin, Mina A, PA-C  naproxen (NAPROSYN) 500 MG tablet Take 1 tablet (500 mg total) by mouth 2 (two) times daily with a meal. 05/03/19   Worthy Rancher B, FNP  predniSONE (STERAPRED UNI-PAK 21 TAB) 10 MG (21) TBPK tablet Use as directed 05/15/19   Jannifer Rodney A, FNP  triamcinolone ointment (KENALOG) 0.5 % Apply 1 application topically 2 (two) times daily. 05/15/19   Junie Spencer, FNP    Family History Family History  Problem Relation Age of Onset  . Diabetes Mother   . Hypertension Mother   .  Stroke Mother   . Heart disease Mother   . Cancer Father   . Diabetes Maternal Grandmother     Social History Social History   Tobacco Use  . Smoking status: Former Smoker    Quit date: 10/27/2013    Years since quitting: 6.3  . Smokeless tobacco: Never Used  Substance Use Topics  . Alcohol use: Yes  . Drug use: No     Allergies   Clindamycin/lincomycin and Nickel   Review of Systems Review of Systems   Physical Exam Triage Vital Signs ED Triage Vitals  Enc Vitals Group     BP 02/18/20 1605 (!) 160/89     Pulse Rate 02/18/20 1605 81     Resp 02/18/20 1605 16     Temp 02/18/20 1605 98.5 F (36.9 C)     Temp Source 02/18/20 1605 Oral     SpO2 02/18/20 1605 100 %     Weight --      Height --      Head Circumference --      Peak Flow --      Pain Score 02/18/20 1606 6     Pain Loc --      Pain Edu? --      Excl. in [place]? --    No data found.  Updated  Vital Signs BP 137/85 (BP Location: Left Arm)   Pulse 81   Temp 98.5 F (36.9 C) (Oral)   Resp 16   LMP 02/09/2020   SpO2 100%   Visual Acuity Right Eye Distance:   Left Eye Distance:   Bilateral Distance:    Right Eye Near:   Left Eye Near:    Bilateral Near:     Physical Exam Vitals and nursing note reviewed.  Constitutional:      General: She is not in acute distress.    Appearance: Normal appearance. She is well-developed and normal weight.  HENT:     Head: Normocephalic and atraumatic.     Right Ear: There is impacted cerumen.     Left Ear: There is impacted cerumen.     Nose: Nose normal.     Mouth/Throat:     Mouth: Mucous membranes are moist.     Pharynx: No posterior oropharyngeal erythema.  Eyes:     Conjunctiva/sclera: Conjunctivae normal.  Cardiovascular:     Rate and Rhythm: Normal rate and regular rhythm.     Heart sounds: Normal heart sounds. No murmur.  Pulmonary:     Effort: Pulmonary effort is normal. No respiratory distress.     Breath sounds: Normal breath sounds.  Abdominal:     General: Bowel sounds are normal. There is no distension.     Palpations: Abdomen is soft. There is no mass.     Tenderness: There is no abdominal tenderness. There is no guarding or rebound.     Hernia: No hernia is present.  Musculoskeletal:        General: Normal range of motion.     Cervical back: Neck supple.  Skin:    General: Skin is warm and dry.     Capillary Refill: Capillary refill takes less than 2 seconds.  Neurological:     General: No focal deficit present.     Mental Status: She is alert and oriented to person, place, and time.  Psychiatric:        Mood and Affect: Mood normal.        Behavior: Behavior normal.  UC Treatments / Results  Labs (all labs ordered are listed, but only abnormal results are displayed) Labs Reviewed - No data to display  EKG   Radiology No results found.  Procedures Procedures (including critical care  time)  Medications Ordered in UC Medications - No data to display  Initial Impression / Assessment and Plan / UC Course  I have reviewed the triage vital signs and the nursing notes.  Pertinent labs & imaging results that were available during my care of the patient were reviewed by me and considered in my medical decision making (see chart for details).     Presents with bilateral cerumen impaction today.  Will clean out ears, and reevaluate.  After cerumen removal, both TMs pearly gray without effusion.  Advised that if she is having pain or sore after cerumen removal, she can take ibuprofen or Tylenol as needed.  Patient agreeable to treatment plan.  Instructed to follow-up with [place] if she develops fever, cough, increasing pain, other concerning symptoms. Final Clinical Impressions(s) / UC Diagnoses   Final diagnoses:  Bilateral impacted cerumen  Right ear pain     Discharge Instructions     Your ears were clogged with wax. They were cleaned out in the office.  If you are experiencing discomfort, you may take ibuprofen or tylenol as needed.   Follow up with our office as needed.     ED Prescriptions    None     PDMP not reviewed this encounter.   Moshe Cipro, NP 02/18/20 1730

## 2020-04-17 ENCOUNTER — Telehealth: Payer: Medicaid Other | Admitting: Physician Assistant

## 2020-04-17 DIAGNOSIS — K047 Periapical abscess without sinus: Secondary | ICD-10-CM

## 2020-04-17 MED ORDER — AMOXICILLIN-POT CLAVULANATE 875-125 MG PO TABS: 1.0000 | ORAL_TABLET | Freq: Two times a day (BID) | ORAL | 0 refills | Status: DC

## 2020-04-17 NOTE — Progress Notes (Signed)
E Visit for Tooth Abscess  We are sorry that you are not feeling well. Here is how we plan to help!  Based on what you shared with me it looks like you have a tooth abscess.  Be sure to keep your appointment with your dentist and have regular follow-ups.  You may need the abscess drained or the tooth removed.   I have prescribed: Augmentin twice daily x 1 week  HOME CARE:  . Take your medications as ordered and take all of them, even if the skin irritation appears to be healing.   GET HELP RIGHT AWAY IF:  . Symptoms that don't begin to go away within 48 hours. . Severe redness persists or worsens . If the area turns color, spreads or swells. . If it blisters and opens, develops yellow-brown crust or bleeds. . You develop a fever or chills. . If the pain increases or becomes unbearable.  . Are unable to keep fluids and food down.  MAKE SURE YOU    Understand these instructions.  Will watch your condition.  Will get help right away if you are not doing well or get worse.  Thank you for choosing an e-visit. Your e-visit answers were reviewed by a board certified advanced clinical practitioner to complete your personal care plan. Depending upon the condition, your plan could have included both over the counter or prescription medications. Please review your pharmacy choice. Make sure the pharmacy is open so you can pick up prescription now. If there is a problem, you may contact your provider through [employer] and have the prescription routed to another pharmacy. Your safety is important to [place]. If you have drug allergies check your prescription carefully.  For the next 24 hours you can use MyChart to ask questions about today's visit, request a non-urgent call back, or ask for a work or school excuse. You will get an email in the next two days asking about your experience. I hope that your e-visit has been valuable and will speed your recovery.  Greater than 5 minutes, yet  less than 10 minutes of time have been spent researching, coordinating and implementing care for this patient today.

## 2020-05-12 ENCOUNTER — Other Ambulatory Visit (HOSPITAL_COMMUNITY)
Admission: RE | Admit: 2020-05-12 | Discharge: 2020-05-12 | Disposition: A | Discharge status: Home / Self Care | Payer: Medicaid Other | Source: Ambulatory Visit | Attending: Family Medicine | Admitting: Family Medicine

## 2020-05-12 DIAGNOSIS — Z01411 Encounter for gynecological examination (general) (routine) with abnormal findings: Secondary | ICD-10-CM | POA: Insufficient documentation

## 2020-05-13 LAB — CYTOLOGY - PAP
Comment: NEGATIVE
Diagnosis: NEGATIVE
Diagnosis: REACTIVE
High risk HPV: NEGATIVE

## 2021-10-31 ENCOUNTER — Telehealth: Payer: Medicaid Other | Admitting: Emergency Medicine

## 2021-10-31 DIAGNOSIS — J029 Acute pharyngitis, unspecified: Secondary | ICD-10-CM

## 2021-10-31 DIAGNOSIS — J069 Acute upper respiratory infection, unspecified: Secondary | ICD-10-CM

## 2021-10-31 MED ORDER — BENZONATATE 100 MG PO CAPS: 100.0000 mg | ORAL_CAPSULE | Freq: Two times a day (BID) | ORAL | 0 refills | Status: DC | PRN

## 2021-10-31 MED ORDER — CHLORHEXIDINE GLUCONATE 0.12 % MT SOLN: 15.0000 mL | Freq: Two times a day (BID) | OROMUCOSAL | 0 refills | Status: DC

## 2021-10-31 NOTE — Progress Notes (Signed)
I have spent 5 minutes in review of e-visit questionnaire, review and updating patient chart, medical decision making and response to patient.   Sharmayne Jablon, PA-C    

## 2021-10-31 NOTE — Progress Notes (Signed)
E-Visit for Sore Throat  We are sorry that you are not feeling well.  Here is how we plan to help!  Your symptoms indicate a likely viral infection (Pharyngitis).   Pharyngitis is inflammation in the back of the throat which can cause a sore throat, scratchiness and sometimes difficulty swallowing.   Pharyngitis is typically caused by a respiratory virus and will just run its course.  Please keep in mind that your symptoms could last up to 10 days.  For throat pain, we recommend over the counter oral pain relief medications such as acetaminophen or aspirin, or anti-inflammatory medications such as ibuprofen or naproxen sodium.  Topical treatments such as oral throat lozenges or sprays may be used as needed.  Avoid close contact with loved ones, especially the very young and elderly.  Remember to wash your hands thoroughly throughout the day as this is the number one way to prevent the spread of infection and wipe down door knobs and counters with disinfectant.  After careful review of your answers, I would not recommend and antibiotic for your condition.  Antibiotics should not be used to treat conditions that we suspect are caused by viruses like the virus that causes the common cold or flu. However, some people can have Strep with atypical symptoms. You may need formal testing in clinic or office to confirm if your symptoms continue or worsen.  Providers prescribe antibiotics to treat infections caused by bacteria. Antibiotics are very powerful in treating bacterial infections when they are used properly.  To maintain their effectiveness, they should be used only when necessary.  Overuse of antibiotics has resulted in the development of super bugs that are resistant to treatment!    I am going to prescribed peridex mouth wash to help alleviate your sore throat symptom as well as tessalon perles for the cough.  Please take medications as directed and if symptoms do not improve or worsen seek in person  evaluation for further testing and treatment.  I hope you feel better soon!  Home Care: Only take medications as instructed by your medical team. Do not drink alcohol while taking these medications. A steam or ultrasonic humidifier can help congestion.  You can place a towel over your head and breathe in the steam from hot water coming from a faucet. Avoid close contacts especially the very young and the elderly. Cover your mouth when you cough or sneeze. Always remember to wash your hands.  Get Help Right Away If: You develop worsening fever or throat pain. You develop a severe head ache or visual changes. Your symptoms persist after you have completed your treatment plan.  Make sure you Understand these instructions. Will watch your condition. Will get help right away if you are not doing well or get worse.   Thank you for choosing an e-visit.  Your e-visit answers were reviewed by a board certified advanced clinical practitioner to complete your personal care plan. Depending upon the condition, your plan could have included both over the counter or prescription medications.  Please review your pharmacy choice. Make sure the pharmacy is open so you can pick up prescription now. If there is a problem, you may contact your provider through [employer] and have the prescription routed to another pharmacy.  Your safety is important to [place]. If you have drug allergies check your prescription carefully.   For the next 24 hours you can use MyChart to ask questions about today's visit, request a non-urgent call back, or ask  for a work or school excuse. You will get an email in the next two days asking about your experience. I hope that your e-visit has been valuable and will speed your recovery.

## 2021-11-06 ENCOUNTER — Telehealth: Payer: Medicaid Other | Admitting: Nurse Practitioner

## 2021-11-06 DIAGNOSIS — A599 Trichomoniasis, unspecified: Secondary | ICD-10-CM

## 2021-11-06 MED ORDER — METRONIDAZOLE 500 MG PO TABS: 500.0000 mg | ORAL_TABLET | Freq: Two times a day (BID) | ORAL | 0 refills | Status: DC

## 2021-11-06 NOTE — Progress Notes (Signed)
E-Visit for Vaginal Symptoms  We are sorry that you are not feeling well. Here is how we plan to help! Based on what you shared with me it looks like you: have Trichomoniasis   Trichomoniasis is among the most common sexually transmitted infections. Risk factors include multiple sexual partners and not using condoms during sex. Trichomoniasis causes a foul-smelling vaginal discharge, genital itching, and painful urination in women. Men typically have no symptoms. Complications include a risk of premature delivery for pregnant women.    The most common causes of vaginosis are:   Bacterial vaginosis which results from an overgrowth of one on several organisms that are normally present in your vagina.   Yeast infections which are caused by a naturally occurring fungus called candida.   Vaginal atrophy (atrophic vaginosis) which results from the thinning of the vagina from reduced estrogen levels after menopause.   Trichomoniasis which is caused by a parasite and is commonly transmitted by sexual intercourse.  Factors that increase your risk of developing vaginosis include: Medications, such as antibiotics and steroids Uncontrolled diabetes Use of hygiene products such as bubble bath, vaginal spray or vaginal deodorant Douching Wearing damp or tight-fitting clothing Using an intrauterine device (IUD) for birth control Hormonal changes, such as those associated with pregnancy, birth control pills or menopause Sexual activity Having a sexually transmitted infection  Your treatment plan is Metronidazole or Flagyl 500mg  twice a day for 7 days.  I have electronically sent this prescription into the pharmacy that you have chosen.  Be sure to take all of the medication as directed. Stop taking any medication if you develop a rash, tongue swelling or shortness of breath. Mothers who are breast feeding should consider pumping and discarding their breast milk while on these antibiotics. However,  there is no consensus that infant exposure at these doses would be harmful.  Remember that medication creams can weaken latex condoms.   HOME CARE:  Good hygiene may prevent some types of vaginosis from recurring and may relieve some symptoms:  Avoid baths, hot tubs and whirlpool spas. Rinse soap from your outer genital area after a shower, and dry the area well to prevent irritation. Don't use scented or harsh soaps, such as those with deodorant or antibacterial action. Avoid irritants. These include scented tampons and pads. Wipe from front to back after using the toilet. Doing so avoids spreading fecal bacteria to your vagina.  Other things that may help prevent vaginosis include:  Don't douche. Your vagina doesn't require cleansing other than normal bathing. Repetitive douching disrupts the normal organisms that reside in the vagina and can actually increase your risk of vaginal infection. Douching won't clear up a vaginal infection. Use a latex condom. Both female and female latex condoms may help you avoid infections spread by sexual contact. Wear cotton underwear. Also wear pantyhose with a cotton crotch. If you feel comfortable without it, skip wearing underwear to bed. Yeast thrives in [place] Your symptoms should improve in the next day or two.  GET HELP RIGHT AWAY IF:  You have pain in your lower abdomen ( pelvic area or over your ovaries) You develop nausea or vomiting You develop a fever Your discharge changes or worsens You have persistent pain with intercourse You develop shortness of breath, a rapid pulse, or you faint.  These symptoms could be signs of problems or infections that need to be evaluated by a medical provider now.  MAKE SURE YOU   Understand these instructions. Will watch  your condition. Will get help right away if you are not doing well or get worse.  Thank you for choosing an e-visit.  Your e-visit answers were reviewed by a board  certified advanced clinical practitioner to complete your personal care plan. Depending upon the condition, your plan could have included both over the counter or prescription medications.  Please review your pharmacy choice. Make sure the pharmacy is open so you can pick up prescription now. If there is a problem, you may contact your provider through [employer] and have the prescription routed to another pharmacy.  Your safety is important to [place]. If you have drug allergies check your prescription carefully.   For the next 24 hours you can use MyChart to ask questions about today's visit, request a non-urgent call back, or ask for a work or school excuse. You will get an email in the next two days asking about your experience. I hope that your e-visit has been valuable and will speed your recovery.  5-10 minutes spent reviewing and documenting in chart.

## 2021-11-06 NOTE — Addendum Note (Signed)
Addended by: Bennie Pierini on: 11/06/2021 05:46 PM   Modules accepted: Orders

## 2022-06-28 ENCOUNTER — Telehealth: Payer: Self-pay | Admitting: Physician Assistant

## 2022-06-28 DIAGNOSIS — K219 Gastro-esophageal reflux disease without esophagitis: Secondary | ICD-10-CM

## 2022-06-28 NOTE — Progress Notes (Signed)
Because of chest pain associated with the heartburn, there is increased concern for gastritis or ulcer which warrants examination and possible lab testing. Therefore, I feel your condition warrants further evaluation and I recommend that you be seen in a face to face visit.   NOTE: There will be NO CHARGE for this eVisit   If you are having a true medical emergency please call 911.      For an urgent face to face visit, [facility] has seven urgent care centers for your convenience:     [facility] at [facility] Directions [phone] [address] [place], [place] [address]    [facility]) Get Driving Directions [phone] [address] [place], [place] [address]  [facility] Center [facility]) Get Driving Directions [phone] [address] [place],  [place]  [address]  [facility] - at [employer] Directions  [phone] [address] [place],  [place] [address]   [facility] Health Urgent Care at [facility] Get Driving Directions [phone] [address] [place], [place] [address]   [facility] Health Urgent Care at [facility] Get Driving Directions  [phone] [address] [place], [place] [address]   [facility] Health Urgent Care at [facility] Directions [phone] [address], [address] [place], [place] [address]  Your MyChart E-visit questionnaire answers were reviewed by a board certified advanced clinical practitioner to complete your personal care plan based on your specific symptoms.  Thank you for using e-Visits.

## 2023-03-30 ENCOUNTER — Telehealth: Payer: Self-pay | Admitting: Physician Assistant

## 2023-03-30 DIAGNOSIS — R3989 Other symptoms and signs involving the genitourinary system: Secondary | ICD-10-CM

## 2023-03-31 MED ORDER — NITROFURANTOIN MONOHYD MACRO 100 MG PO CAPS: 100.0000 mg | ORAL_CAPSULE | Freq: Two times a day (BID) | ORAL | 0 refills | Status: DC

## 2023-03-31 NOTE — Progress Notes (Signed)
E-Visit for Urinary Problems  We are sorry that you are not feeling well.  Here is how we plan to help!  Based on what you shared with me it looks like you most likely have a simple urinary tract infection.  A UTI (Urinary Tract Infection) is a bacterial infection of the bladder.  Most cases of urinary tract infections are simple to treat but a key part of your care is to encourage you to drink plenty of fluids and watch your symptoms carefully.  I have prescribed MacroBid 100 mg twice a day for 5 days.  Your symptoms should gradually improve. Call us if the burning in your urine worsens, you develop worsening fever, back pain or pelvic pain or if your symptoms do not resolve after completing the antibiotic.  Urinary tract infections can be prevented by drinking plenty of water to keep your body hydrated.  Also be sure when you wipe, wipe from front to back and don't hold it in!  If possible, empty your bladder every 4 hours.  HOME CARE Drink plenty of fluids Compete the full course of the antibiotics even if the symptoms resolve Remember, when you need to go.go. Holding in your urine can increase the likelihood of getting a UTI! GET HELP RIGHT AWAY IF: You cannot urinate You get a high fever Worsening back pain occurs You see blood in your urine You feel sick to your stomach or throw up You feel like you are going to pass out  MAKE SURE YOU  Understand these instructions. Will watch your condition. Will get help right away if you are not doing well or get worse.   Thank you for choosing an e-visit.  Your e-visit answers were reviewed by a board certified advanced clinical practitioner to complete your personal care plan. Depending upon the condition, your plan could have included both over the counter or prescription medications.  Please review your pharmacy choice. Make sure the pharmacy is open so you can pick up prescription now. If there is a problem, you may contact your  provider through MyChart messaging and have the prescription routed to another pharmacy.  Your safety is important to us. If you have drug allergies check your prescription carefully.   For the next 24 hours you can use MyChart to ask questions about today's visit, request a non-urgent call back, or ask for a work or school excuse. You will get an email in the next two days asking about your experience. I hope that your e-visit has been valuable and will speed your recovery.  I have spent 5 minutes in review of e-visit questionnaire, review and updating patient chart, medical decision making and response to patient.   Jenalyn Girdner M Florrie Ramires, PA-C  

## 2023-05-13 ENCOUNTER — Telehealth: Payer: Self-pay | Admitting: Nurse Practitioner

## 2023-05-13 DIAGNOSIS — L309 Dermatitis, unspecified: Secondary | ICD-10-CM

## 2023-05-13 MED ORDER — PREDNISONE 20 MG PO TABS: 40.0000 mg | ORAL_TABLET | Freq: Every day | ORAL | 0 refills | Status: AC

## 2023-05-13 MED ORDER — TRIAMCINOLONE ACETONIDE 0.1 % EX CREA: 1.0000 | TOPICAL_CREAM | Freq: Two times a day (BID) | CUTANEOUS | 0 refills | Status: DC

## 2023-05-13 NOTE — Progress Notes (Signed)

## 2023-11-30 ENCOUNTER — Telehealth: Payer: Medicaid Other | Admitting: Physician Assistant

## 2023-11-30 DIAGNOSIS — L2082 Flexural eczema: Secondary | ICD-10-CM

## 2023-11-30 MED ORDER — PREDNISONE 20 MG PO TABS: 20.0000 mg | ORAL_TABLET | Freq: Every day | ORAL | 0 refills | Status: DC

## 2023-11-30 MED ORDER — TRIAMCINOLONE ACETONIDE 0.1 % EX CREA: 1.0000 | TOPICAL_CREAM | Freq: Two times a day (BID) | CUTANEOUS | 0 refills | Status: DC

## 2023-11-30 NOTE — Progress Notes (Signed)

## 2023-12-18 ENCOUNTER — Encounter (HOSPITAL_COMMUNITY): Payer: Self-pay

## 2023-12-18 ENCOUNTER — Ambulatory Visit (HOSPITAL_COMMUNITY)
Admission: EM | Admit: 2023-12-18 | Discharge: 2023-12-18 | Disposition: A | Discharge status: Home / Self Care | Payer: BC Managed Care – PPO | Attending: Internal Medicine | Admitting: Internal Medicine

## 2023-12-18 DIAGNOSIS — R0789 Other chest pain: Secondary | ICD-10-CM

## 2023-12-18 MED ORDER — ALUM & MAG HYDROXIDE-SIMETH 200-200-20 MG/5ML PO SUSP
ORAL | Status: AC
  Filled 2023-12-18: qty 30

## 2023-12-18 MED ORDER — LIDOCAINE VISCOUS HCL 2 % MT SOLN
15.0000 mL | Freq: Once | OROMUCOSAL | Status: AC
  Administered 2023-12-18: 15 mL via OROMUCOSAL

## 2023-12-18 MED ORDER — LIDOCAINE VISCOUS HCL 2 % MT SOLN
OROMUCOSAL | Status: AC
  Filled 2023-12-18: qty 15

## 2023-12-18 MED ORDER — KETOROLAC TROMETHAMINE 30 MG/ML IJ SOLN
30.0000 mg | Freq: Once | INTRAMUSCULAR | Status: AC
  Administered 2023-12-18: 30 mg via INTRAMUSCULAR

## 2023-12-18 MED ORDER — ALUM & MAG HYDROXIDE-SIMETH 200-200-20 MG/5ML PO SUSP
30.0000 mL | Freq: Once | ORAL | Status: AC
  Administered 2023-12-18: 30 mL via ORAL

## 2023-12-18 MED ORDER — KETOROLAC TROMETHAMINE 30 MG/ML IJ SOLN
INTRAMUSCULAR | Status: AC
  Filled 2023-12-18: qty 1

## 2023-12-18 NOTE — ED Provider Notes (Signed)
 [facility]    CSN: [id number] Arrival date & time: 12/18/23  1702      History   Chief Complaint Chief Complaint  Patient presents with   Chest Pain    HPI Amanda Beasley is a 33 y.o. female.   33 year old female who presents to urgent care with complaints of left-sided chest pain for the last 8 to 10 hours and dizziness for 3 days.  She reports that she has not had previous symptoms like this.  She does relate that she has been under significant stress lately with studying for a huge exam that she took Friday.  She denies shortness of breath, congestion, sore throat, recent respiratory illness, ear pain.  She does report that she has been burping more lately as well.  She did take aspirin and took 6 to help with the symptoms.  She felt that the symptoms got somewhat better.  The area can be tender to palpitation.  She does not feel that it is positional in nature.  She does relate that she has been chewing neuro gum which is a caffeinated gum and this is new.   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 Authorizing Provider  triamcinolone  cream (KENALOG ) 0.1 % Apply 1 Application topically 2 (two) times daily. 11/30/23   Vivienne Delon HERO, PA-C    Family History Family History  Problem Relation Age of Onset   Diabetes Mother    Hypertension Mother    Stroke Mother    Heart disease Mother    Cancer Father    Diabetes Maternal Grandmother     Social History Social History   Tobacco Use   Smoking status: Former    Current packs/day: 0.00    Types: Cigarettes    Quit date: 10/27/2013    Years since quitting: 10.1   Smokeless tobacco: Never  Substance Use Topics   Alcohol use: Yes   Drug use: No     Allergies   Clindamycin /lincomycin and Nickel   Review of Systems Review of Systems  Constitutional:  Negative for chills and fever.  HENT:  Negative for ear pain and sore throat.   Eyes:  Negative for  pain and visual disturbance.  Respiratory:  Negative for cough and shortness of breath.   Cardiovascular:  Positive for chest pain. Negative for palpitations.  Gastrointestinal:  Negative for abdominal pain and vomiting.  Genitourinary:  Negative for dysuria and hematuria.  Musculoskeletal:  Negative for arthralgias and back pain.  Skin:  Negative for color change and rash.  Neurological:  Positive for dizziness. Negative for seizures and syncope.  All other systems reviewed and are negative.    Physical Exam Triage Vital Signs ED Triage Vitals  Encounter Vitals Group     BP 12/18/23 1931 (!) 161/91     Systolic BP Percentile --      Diastolic BP Percentile --      Pulse Rate 12/18/23 1931 100     Resp 12/18/23 1931 16     Temp 12/18/23 1931 99.1 F (37.3 C)     Temp Source 12/18/23 1931 Oral     SpO2 12/18/23 1931 98 %     Weight 12/18/23 1932 245 lb (111.1 kg)     Height 12/18/23 1932 5' 8 (1.727 m)     Head Circumference --      Peak Flow --      Pain Score 12/18/23 1932 5      Pain Loc --      Pain Education --      Exclude from Growth Chart --    No data found.  Updated Vital Signs BP (!) 161/91 (BP Location: Right Arm)   Pulse 100   Temp 99.1 F (37.3 C) (Oral)   Resp 16   Ht 5' 8 (1.727 m)   Wt 245 lb (111.1 kg)   LMP 12/14/2023 (Exact Date)   SpO2 98%   BMI 37.25 kg/m   Visual Acuity Right Eye Distance:   Left Eye Distance:   Bilateral Distance:    Right Eye Near:   Left Eye Near:    Bilateral Near:     Physical Exam Vitals and nursing note reviewed.  Constitutional:      General: She is not in acute distress.    Appearance: She is well-developed.  HENT:     Head: Normocephalic and atraumatic.  Eyes:     Conjunctiva/sclera: Conjunctivae normal.  Cardiovascular:     Rate and Rhythm: Normal rate and regular rhythm.     Heart sounds: No murmur heard. Pulmonary:     Effort: Pulmonary effort is normal. No respiratory distress.     Breath sounds: Normal breath sounds.  Abdominal:     Palpations: Abdomen is soft.     Tenderness: There is no abdominal tenderness.  Musculoskeletal:        General: No swelling.     Cervical back: Neck supple.  Skin:    General: Skin is warm and dry.     Capillary Refill: Capillary refill takes less than 2 seconds.  Neurological:     Mental Status: She is alert.  Psychiatric:        Mood and Affect: Mood normal.      UC Treatments / Results  Labs (all labs ordered are listed, but only abnormal results are displayed) Labs Reviewed - No data to display  EKG   Radiology No results found.  Procedures Procedures (including critical care time)  Medications Ordered in UC Medications  ketorolac  (TORADOL ) 30 MG/ML injection 30 mg (has no administration in time range)  alum & mag hydroxide-simeth (MAALOX/MYLANTA) 200-200-20 MG/5ML suspension 30 mL (has no administration in time range)  lidocaine  (XYLOCAINE ) 2 % viscous mouth solution 15 mL (has no administration in time range)     Initial Impression / Assessment and Plan / UC Course  I have reviewed the triage vital signs and the nursing notes.  Pertinent labs & imaging results that were available during my care of the patient were reviewed by me and considered in my medical decision making (see chart for details).     Chest wall pain   Symptoms improved with both toradol  and GI cocktail which supports this being either musculo-skeletal or gastro-intestinal in nature vs cardiac especially as the EKG was normal.  Also could be stress related given recent external stressors in her life.  At this point recommend monitoring symptoms and avoid high doses of caffeine. If you feel the symptoms return or worsen, then go to the ER for evaluation. Follow up with PCP. Return to urgent care or PCP if symptoms worsen or fail to resolve.    Final Clinical Impressions(s) / UC Diagnoses   Final diagnoses:  None   Discharge Instructions   None    ED Prescriptions   None    PDMP not reviewed this encounter.   Teresa Almarie LABOR, [place] 12/18/23 2042

## 2023-12-18 NOTE — Discharge Instructions (Addendum)
 Symptoms improved with both toradol  and GI cocktail which supports this being either musculo-skeletal or gastro-intestinal in nature vs cardiac especially as the EKG was normal. At this point recommend monitoring symptoms and avoid high doses of caffeine. If you feel the symptoms return or worsen, then go to the ER for evaluation. Follow up with PCP. Return to urgent care or PCP if symptoms worsen or fail to resolve.

## 2023-12-18 NOTE — ED Triage Notes (Addendum)
 Patient here today with c/o left side chest pain and a little dizziness X 3 days. She also noticed at the same time the chest pain started she had some left shoulder pain. Patient has taking ASA 81 mg with some relief. Patient states that she took 6 of them at once. Patient has also noticed that she has been burping more than she usually does over the past couple of weeks.

## 2024-03-03 ENCOUNTER — Telehealth: Admitting: Nurse Practitioner

## 2024-03-03 DIAGNOSIS — L309 Dermatitis, unspecified: Secondary | ICD-10-CM | POA: Diagnosis not present

## 2024-03-03 MED ORDER — BETAMETHASONE DIPROPIONATE 0.05 % EX CREA: TOPICAL_CREAM | Freq: Two times a day (BID) | CUTANEOUS | 0 refills | Status: DC

## 2024-03-03 NOTE — Progress Notes (Signed)
 I have spent 5 minutes in review of e-visit questionnaire, review and updating patient chart, medical decision making and response to patient.   Claiborne Rigg, NP

## 2024-03-03 NOTE — Progress Notes (Signed)
 At this time we can not continue to prescribe steroids by mouth. You need to establish care with a PCP. I will however be more than happy to prescribe you high dose steroid cream today to apply to your hands twice per day.  If you do not have a PCP, [facility] offers a free physician referral service available at [phone]. Our trained staff has the experience, knowledge and resources to put you in touch with a physician who is right for you.    If you are having a true medical emergency please call 911.   Your e-visit answers were reviewed by a board certified advanced clinical practitioner to complete your personal care plan.  Thank you for using e-Visits.    E-Visit for Eczema  We are sorry that you are not feeling well. Here is how we plan to help! Based on what you shared with me it looks like you have eczema (atopic dermatitis).  Although the cause of eczema is not completely understood, genetics appear to play a strong role, and people with a family history of eczema are at increased risk of developing the condition. In most people with eczema, there is a genetic abnormality in the outermost layer of the skin, called the epidermis   Most people with eczema develop their first symptoms as children, before the age of 25. Intense itching of the skin, patches of redness, small bumps, and skin flaking are common. Scratching can further inflame the skin and worsen the itching. The itchiness may be more noticeable at nighttime.  Eczema commonly affects the back of the neck, the elbow creases, and the backs of the knees. Other affected areas may include the face, wrists, and forearms. The skin may become thickened and darkened, or even scarred, from repeated scratching. Eliminating factors that aggravate your eczema symptoms can help to control the symptoms. Possible triggers may include: ? Cold or dry environments ? Sweating ? Emotional stress or anxiety ? Rapid temperature changes ?  Exposure to certain chemicals or cleaning solutions, including soaps and detergents, perfumes and cosmetics, wool or synthetic fibers, dust, sand, and cigarette smoke Keeping your skin hydrated Emollients -- Emollients are creams and ointments that moisturize the skin and prevent it from drying out. The best emollients for people with eczema are thick creams (such as Eucerin, Cetaphil, and Nutraderm) or ointments (such as petroleum jelly, Aquaphor, and Vaseline), which contain little to no water. Emollients are most effective when applied immediately after bathing. Emollients can be applied twice a day or more often if needed. Lotions contain more water than creams and ointments and are less effective for moisturizing the skin. Bathing -- It is not clear if showers or baths are better for keeping the skin hydrated. Lukewarm baths or showers can hydrate and cool the skin, temporarily relieving itching from eczema. An unscented, mild soap or non-soap cleanser (such as Cetaphil) should be used sparingly. Apply an emollient immediately after bathing or showering to prevent your skin from drying out as a result of water evaporation. Emollient bath additives (products you add to the bath water) have not been found to help relieve symptoms. Hot or long baths (more than 10 to 15 minutes) and showers should be avoided since they can dry out the skin.  Based on what you shared with me you may have eczema.   Betamethasone ointment (or cream).  Apply to effected areas twice per day   I recommend dilute bleach baths for people with eczema. These baths help  to decrease the number of bacteria on the skin that can cause infections or worsen symptoms. To prepare a bleach bath, one-fourth to one-half cup of bleach is placed in a full bathtub (about 40 gallons) of water. Bleach baths are usually taken for 5 to 10 minutes twice per week and should be followed by application of an emollient (listed above). I recommend you  take Benadryl 25mg  - 50mg  every 4 hours to control the symptoms (including itching) but if they last over 24 hours it is best that you see an office based provider for follow up.  HOME CARE: Take lukewarm showers or baths Apply creams and ointments to prevent the skin from drying (Eucerin, Cetaphil, Nutraderm, petroleum jelly, Aquaphor or Vaseline) - these products contain less water than other lotions and are more effective for moisturizing the skin Limit exposure to cold or dry environments, sweating, emotional stress and anxiety, rapid temperature changes and exposure to chemicals and cleaning products, soaps and detergents, perfumes, cosmetics, wool and synthetic fibers, dust, sand and cigarette- factors which can aggravate eczema symptoms.  Use a hydrocortisone cream once or twice a day Take an antihistamine like Benadryl for widespread rashes that itch.  The adult dosage of Benadryl is 25-50 mg by mouth 4 times daily. Caution: This type of medication may cause sleepiness.  Do not drink alcohol, drive, or operate dangerous machinery while taking antihistamines.  Do not take these medications if you have prostate enlargement.  Read the package instructions thoroughly on all medications that you take.  GET HELP RIGHT AWAY IF: Symptoms that don't go away after treatment. Severe itching that persists. You develop a fever. Your skin begins to drain. You have a sore throat. You become short of breath.  MAKE SURE YOU   Understand these instructions. Will watch your condition. Will get help right away if you are not doing well or get worse.    Thank you for choosing an e-visit.  Your e-visit answers were reviewed by a board certified advanced clinical practitioner to complete your personal care plan. Depending upon the condition, your plan could have included both over the counter or prescription medications.  Please review your pharmacy choice. Make sure the pharmacy is open so you can  pick up prescription now. If there is a problem, you may contact your provider through [employer] and have the prescription routed to another pharmacy.  Your safety is important to [place]. If you have drug allergies check your prescription carefully.   For the next 24 hours you can use MyChart to ask questions about today's visit, request a non-urgent call back, or ask for a work or school excuse. You will get an email in the next two days asking about your experience. I hope that your e-visit has been valuable and will speed your recovery.

## 2024-05-16 ENCOUNTER — Telehealth: Admitting: Physician Assistant

## 2024-05-16 DIAGNOSIS — M545 Low back pain, unspecified: Secondary | ICD-10-CM

## 2024-05-16 MED ORDER — NAPROXEN 500 MG PO TABS: 500.0000 mg | ORAL_TABLET | Freq: Two times a day (BID) | ORAL | 0 refills | Status: DC

## 2024-05-16 MED ORDER — CYCLOBENZAPRINE HCL 10 MG PO TABS: 10.0000 mg | ORAL_TABLET | Freq: Three times a day (TID) | ORAL | 0 refills | Status: DC | PRN

## 2024-05-16 NOTE — Progress Notes (Signed)
 I have spent 5 minutes in review of e-visit questionnaire, review and updating patient chart, medical decision making and response to patient.   Piedad Climes, PA-C

## 2024-05-16 NOTE — Progress Notes (Signed)

## 2024-05-27 ENCOUNTER — Telehealth: Admitting: Physician Assistant

## 2024-05-27 DIAGNOSIS — L301 Dyshidrosis [pompholyx]: Secondary | ICD-10-CM

## 2024-05-27 MED ORDER — PREDNISONE 20 MG PO TABS: 20.0000 mg | ORAL_TABLET | Freq: Every day | ORAL | 0 refills | Status: DC

## 2024-05-27 NOTE — Progress Notes (Signed)

## 2024-09-23 ENCOUNTER — Telehealth: Admitting: Physician Assistant

## 2024-09-23 DIAGNOSIS — L301 Dyshidrosis [pompholyx]: Secondary | ICD-10-CM | POA: Diagnosis not present

## 2024-09-23 MED ORDER — PREDNISONE 20 MG PO TABS: 40.0000 mg | ORAL_TABLET | Freq: Every day | ORAL | 0 refills | Status: DC

## 2024-09-23 NOTE — Progress Notes (Signed)

## 2025-01-08 ENCOUNTER — Telehealth: Admitting: Physician Assistant

## 2025-01-08 DIAGNOSIS — M5441 Lumbago with sciatica, right side: Secondary | ICD-10-CM

## 2025-01-08 DIAGNOSIS — W009XXA Unspecified fall due to ice and snow, initial encounter: Secondary | ICD-10-CM

## 2025-01-08 MED ORDER — IBUPROFEN 600 MG PO TABS: 600.0000 mg | ORAL_TABLET | Freq: Three times a day (TID) | ORAL | 0 refills | Status: AC | PRN

## 2025-01-08 MED ORDER — CYCLOBENZAPRINE HCL 10 MG PO TABS: 5.0000 mg | ORAL_TABLET | Freq: Three times a day (TID) | ORAL | 0 refills | Status: AC | PRN

## 2025-01-08 NOTE — Progress Notes (Signed)
 We are sorry that you are not feeling well.  Here is how we plan to help!  Based on what you have shared with me it, appears you may be experiencing acute back pain.   Acute back pain is defined as musculoskeletal pain that can resolve in 1-3 weeks with conservative treatment.  I have prescribed a non-steroid anti-inflammatory (NSAID) Ibuprofen  600 mg -- take one tablet by mouth every 8 hours as needed, as well as a muscle relaxant, Flexeril  10 mg every eight hours as needed. Some patients experience stomach irritation or in increased heartburn with anti-inflammatory drugs.  Please keep in mind that muscle relaxer's can cause fatigue and should not be taken while at work or driving.  Back pain is very common.  The pain often gets better over time.  The cause of back pain is usually not dangerous.  Most people can learn to manage their back pain on their own.  Home Care Stay active.  Start with short walks on flat ground if you can.  Try to walk farther each day. Do not sit, drive or stand in one place for more than 30 minutes.  Do not stay in bed. Do not fully avoid exercise or work.  Activity can help your back heal faster. Be careful when you bend or lift an object.  Bend at your knees, keep the object close to you, and do not twist. Sleep on a firm mattress.  Lie on your side, and bend your knees.  If you lie on your back, put a pillow under your knees. Only take medicines as told by your doctor. Put ice on the injured area. Put ice in a plastic bag Place a towel between your skin and the bag Leave the ice on for 15-20 minutes, 3-4 times a day for the first 2-3 days.  After that, you can switch between ice and heat packs. Ask your doctor about back exercises or massage.   Get Help Right Way If: Your pain does not go away with rest and treatments given today. Your pain does not go away within 1 week. You have new problems. You do not feel well. The pain spreads into your legs. You  cannot control when you poop (bowel movement) or pee (urinate). You feel sick to your stomach (nauseous) or throw up (vomit). You have belly (abdominal) pain. You feel like you may pass out (faint). If you develop a fever.  Make Sure you: Understand these instructions. Continue to monitor your condition for any changes. Will get help right away if you are not doing well or get worse.  Your e-visit answers were reviewed by a board certified advanced clinical practitioner to complete your personal care plan.  Depending on the condition, your plan could have included both over the counter or prescription medications.  If there is a problem, please reply once you have received a response from your provider.  Your safety is important to us .  If you have drug allergies, check your prescription carefully.    You can use MyChart to ask questions about todays visit, request a non-urgent call back, or ask for a work or school excuse for 24 hours related to this e-Visit. If it has been greater than 24 hours you will need to follow up with your provider or enter a new e-Visit to address those concerns.  You will get an e-mail in the next two days asking about your experience.  I hope that your e-visit has been valuable and  will speed your recovery. Thank you for using e-visits.   I have spent 5 minutes in review of e-visit questionnaire, review and updating patient chart, medical decision making and response to patient.   Delon CHRISTELLA Dickinson, PA-C
